# Patient Record
Sex: Female | Born: 1975 | Race: White | Hispanic: No | Smoking: Current every day smoker
Health system: Southern US, Community
[De-identification: ages and names within clinical notes are randomized; demographics above are authoritative.]

## PROBLEM LIST (undated history)

## (undated) DIAGNOSIS — I10 Essential (primary) hypertension: Secondary | ICD-10-CM

## (undated) HISTORY — PX: ABDOMINAL HYSTERECTOMY: SHX81

## (undated) HISTORY — PX: BRAIN SURGERY: SHX531

## (undated) HISTORY — PX: ANKLE SURGERY: SHX546

---

## 2000-01-21 ENCOUNTER — Other Ambulatory Visit: Admission: RE | Admit: 2000-01-21 | Discharge: 2000-01-21 | Payer: Self-pay | Admitting: Obstetrics & Gynecology

## 2000-11-09 ENCOUNTER — Inpatient Hospital Stay (HOSPITAL_COMMUNITY): Admission: AD | Admit: 2000-11-09 | Discharge: 2000-11-12 | Payer: Self-pay | Admitting: Obstetrics & Gynecology

## 2000-12-27 ENCOUNTER — Emergency Department (HOSPITAL_COMMUNITY): Admission: EM | Admit: 2000-12-27 | Discharge: 2000-12-27 | Payer: Self-pay | Admitting: Emergency Medicine

## 2001-01-04 ENCOUNTER — Ambulatory Visit (HOSPITAL_COMMUNITY): Admission: RE | Admit: 2001-01-04 | Discharge: 2001-01-04 | Payer: Self-pay | Admitting: Orthopaedic Surgery

## 2001-01-04 ENCOUNTER — Encounter: Payer: Self-pay | Admitting: Orthopaedic Surgery

## 2001-02-09 ENCOUNTER — Other Ambulatory Visit: Admission: RE | Admit: 2001-02-09 | Discharge: 2001-02-09 | Payer: Self-pay | Admitting: Obstetrics and Gynecology

## 2006-01-27 ENCOUNTER — Observation Stay (HOSPITAL_COMMUNITY): Admission: AD | Admit: 2006-01-27 | Discharge: 2006-01-28 | Payer: Self-pay | Admitting: Obstetrics and Gynecology

## 2006-02-19 ENCOUNTER — Encounter (INDEPENDENT_AMBULATORY_CARE_PROVIDER_SITE_OTHER): Payer: Self-pay | Admitting: Specialist

## 2006-02-19 ENCOUNTER — Inpatient Hospital Stay (HOSPITAL_COMMUNITY): Admission: RE | Admit: 2006-02-19 | Discharge: 2006-02-21 | Payer: Self-pay | Admitting: Obstetrics and Gynecology

## 2006-07-03 ENCOUNTER — Ambulatory Visit (HOSPITAL_COMMUNITY): Admission: RE | Admit: 2006-07-03 | Discharge: 2006-07-03 | Payer: Self-pay | Admitting: Internal Medicine

## 2006-07-10 ENCOUNTER — Ambulatory Visit (HOSPITAL_COMMUNITY): Admission: RE | Admit: 2006-07-10 | Discharge: 2006-07-10 | Payer: Self-pay | Admitting: Internal Medicine

## 2006-12-10 ENCOUNTER — Ambulatory Visit: Payer: Self-pay | Admitting: Pain Medicine

## 2007-01-05 ENCOUNTER — Ambulatory Visit (HOSPITAL_COMMUNITY): Admission: RE | Admit: 2007-01-05 | Discharge: 2007-01-05 | Payer: Self-pay | Admitting: Internal Medicine

## 2007-09-23 IMAGING — US US TRANSVAGINAL NON-OB
1 series · 14 of 25 positions shown · non-contrast
Comparison: none

CLINICAL DATA: Anemia, please evaluate.
 TRANSABDOMINAL AND TRANSVAGINAL PELVIC ULTRASOUND:
TECHNIQUE: Both transabdominal and transvaginal ultrasound examinations of the pelvis were performed including evaluation of the uterus, ovaries, adnexal regions, and pelvic cul-de-sac.

[Series 1: unknown · 0.33mm/px · 14 of 87 slices shown]
[im 1/87]
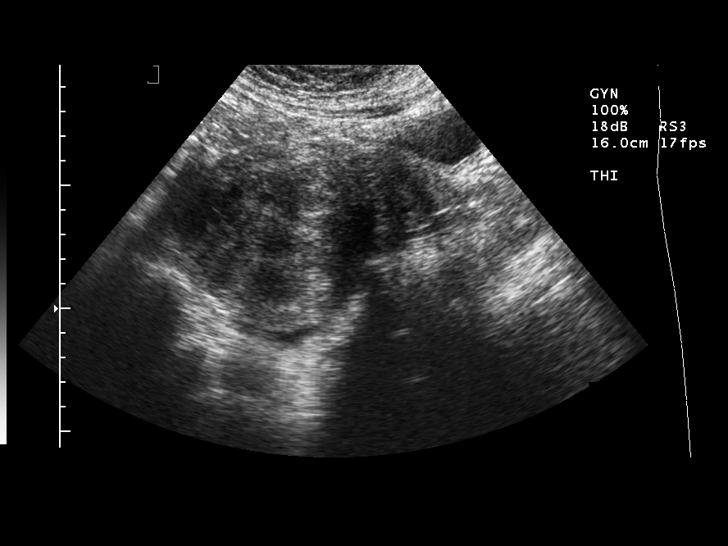
[im 8/87]
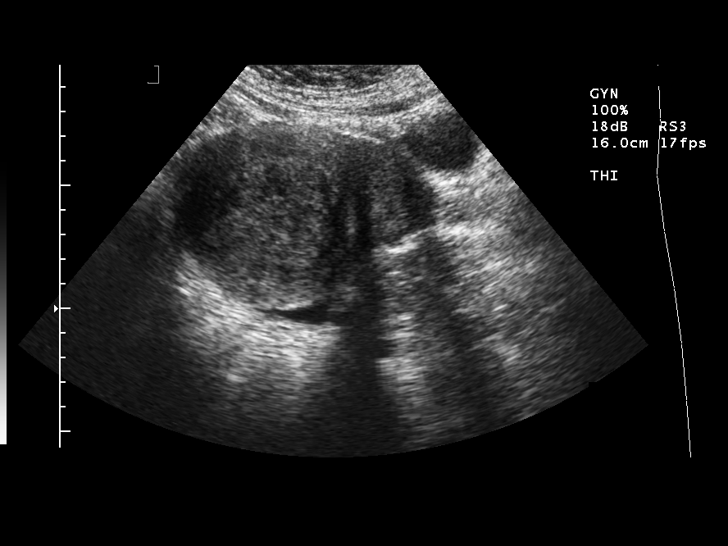
[im 15/87]
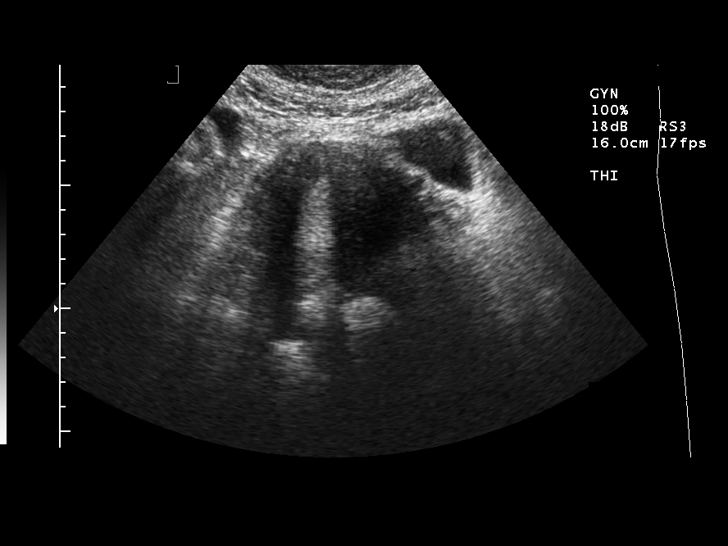
[im 22/87]
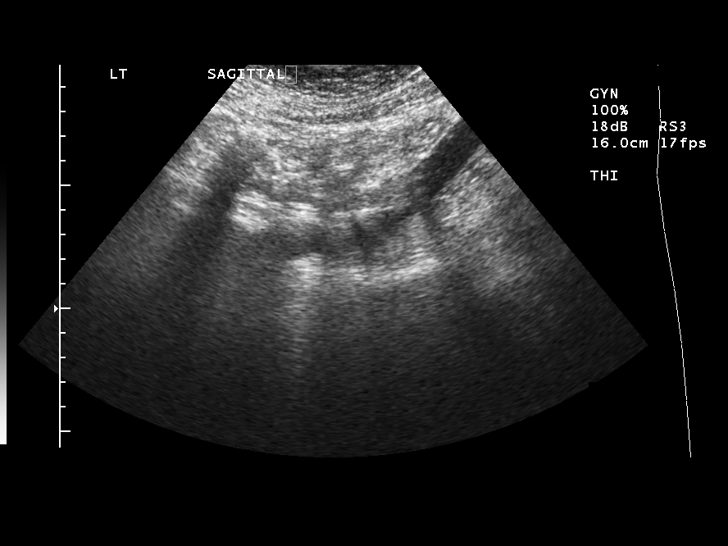
[im 29/87]
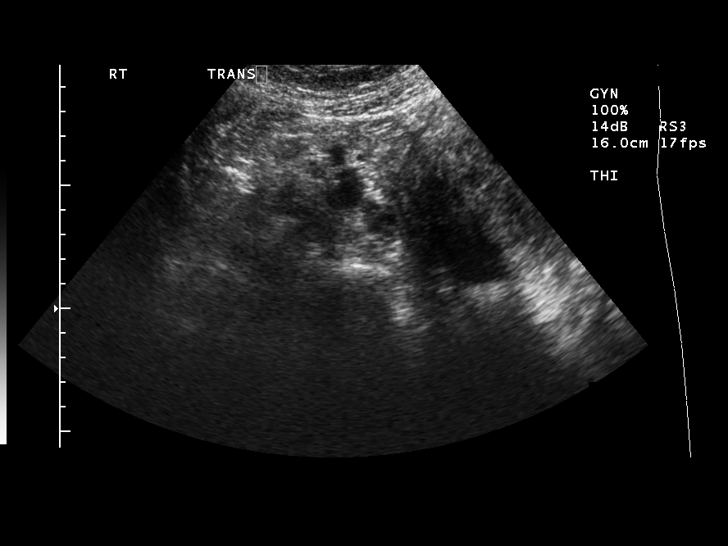
[im 33/87]
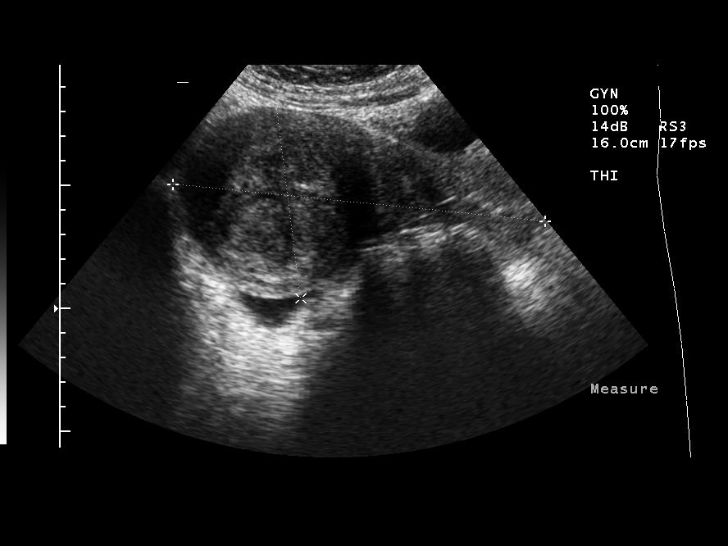
[im 40/87]
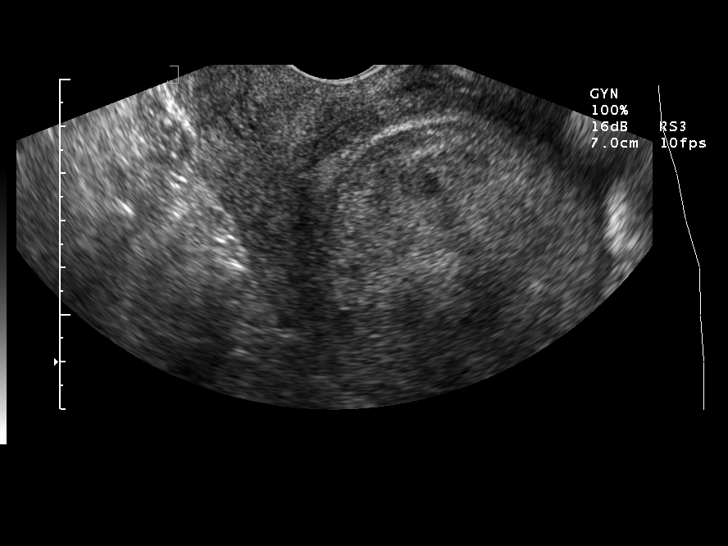
[im 47/87]
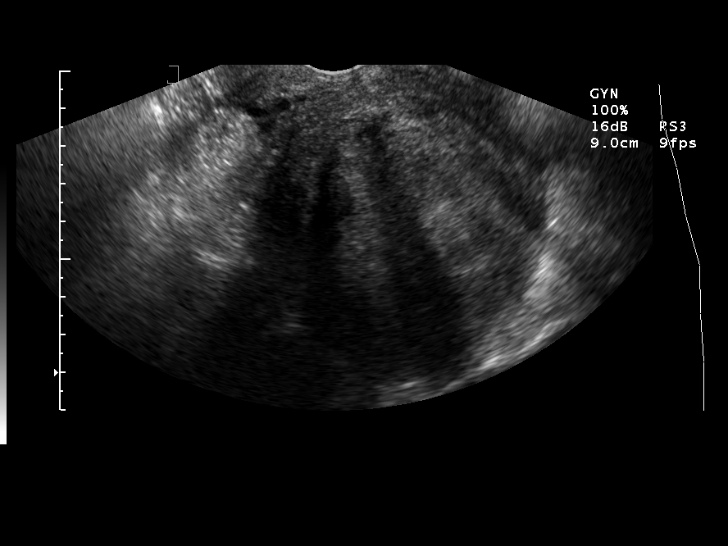
[im 54/87]
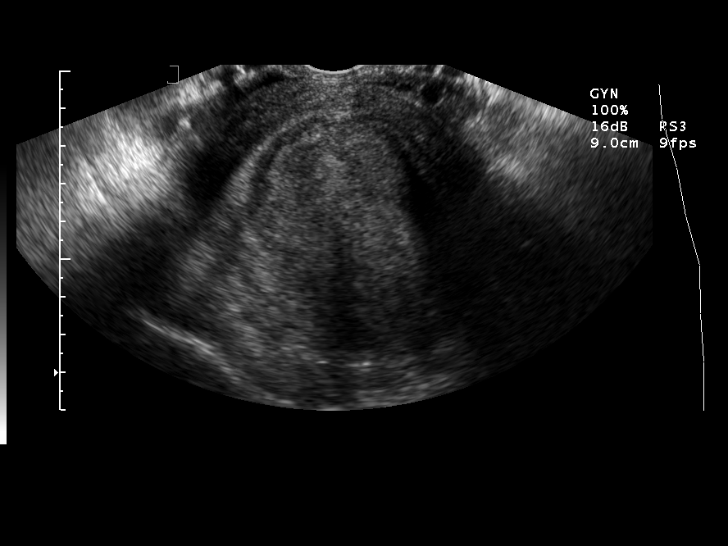
[im 58/87]
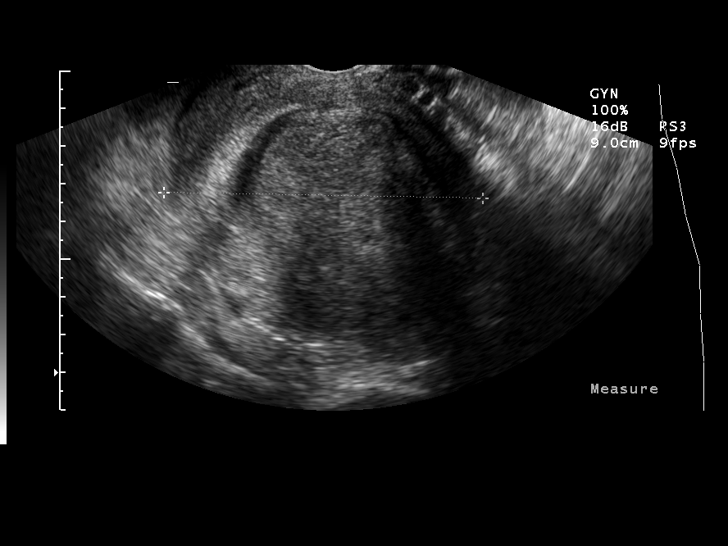
[im 65/87]
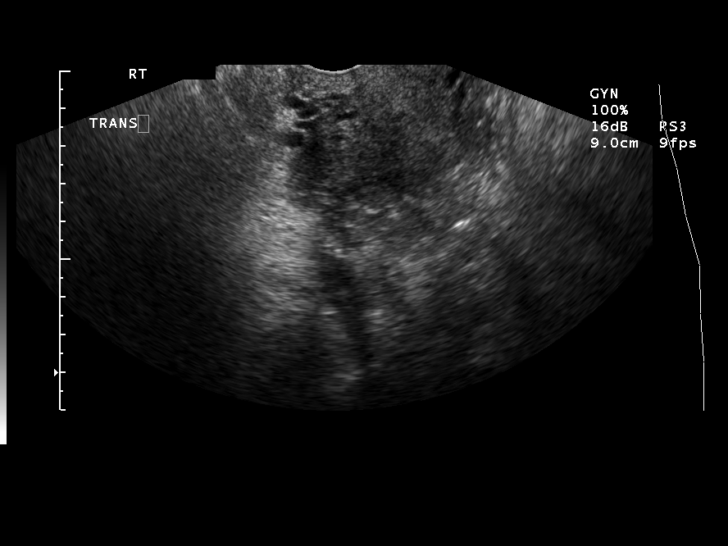
[im 72/87]
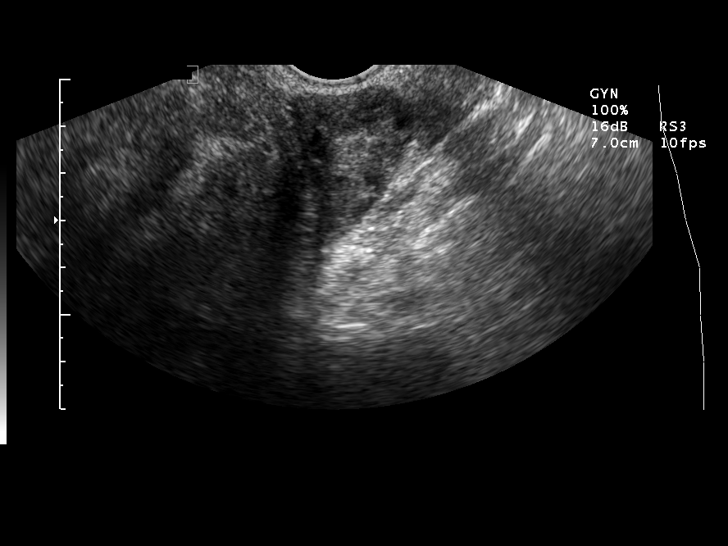
[im 79/87]
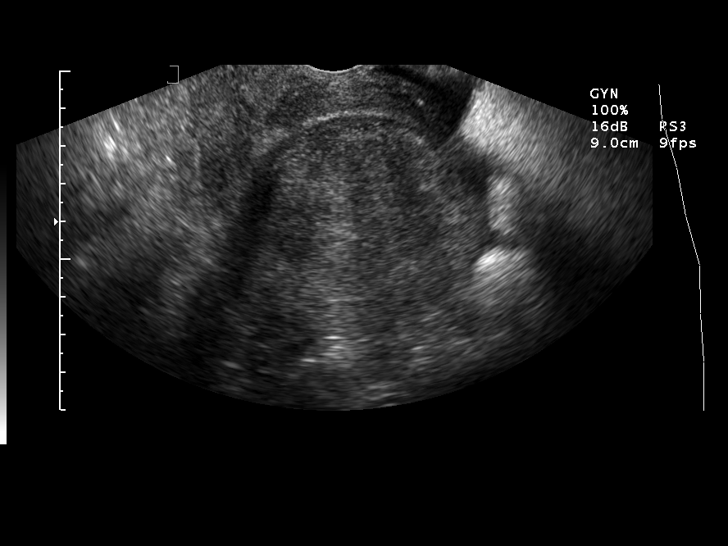
[im 87/87]
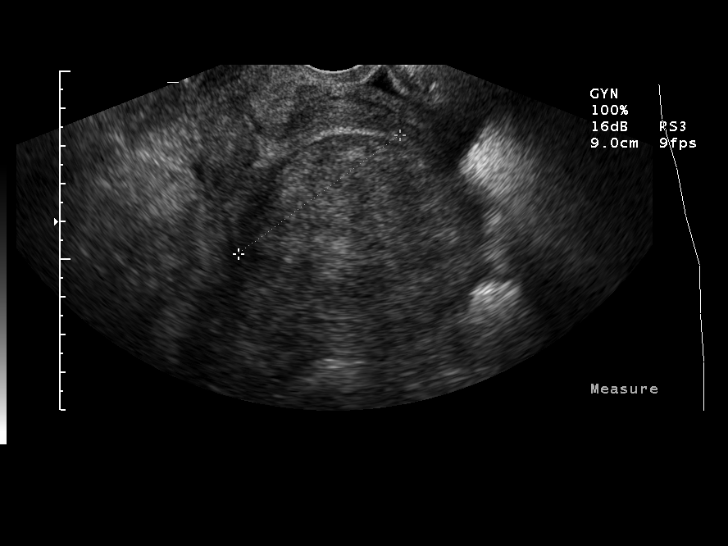

[14 of 25 positions shown; findings below may reference images not displayed]

FINDINGS: The uterus is enlarged and contains multiple fibroids.  The uterus measures 15.2 x 7.9 x 9.2 in size.  The largest fibroid is located within the body/fundus of the uterus and measures 6.5 x 6.4 x 6.7 cm in size.  This does extend into a submucosal location.  The right ovary is not visualized.  The left ovary has a normal appearance with multiple small follicles.  The left ovary measures 3.7 x 1.8 x 2.6 cm in size.  There is a small amount of free pelvic fluid present.  The endometrial stripe is distorted and not well measured due to the large uterine fibroid.
IMPRESSION: Enlarged, likely fibroid uterus with a large fibroid measuring 6.7 cm in size within the fundal portion of the uterus extending into the submucosal location.  Nonvisualization of the right ovary.  Normal appearing left ovary.

## 2008-08-30 IMAGING — CR DG FOOT COMPLETE 3+V*R*
3 series · 3 of 3 positions shown · non-contrast
Comparison: none

HISTORY: Right fifth toe pain and swelling, a recent injury

RIGHT FOOT 3 VIEWS:
Oblique fracture distal diaphysis of proximal phalanx of right fifth toe.
Mild overriding and lateral displacement.
Mineralization otherwise normal.
Joint spaces preserved.
No additional fracture or dislocation.

[view not recorded (1 of 3)]
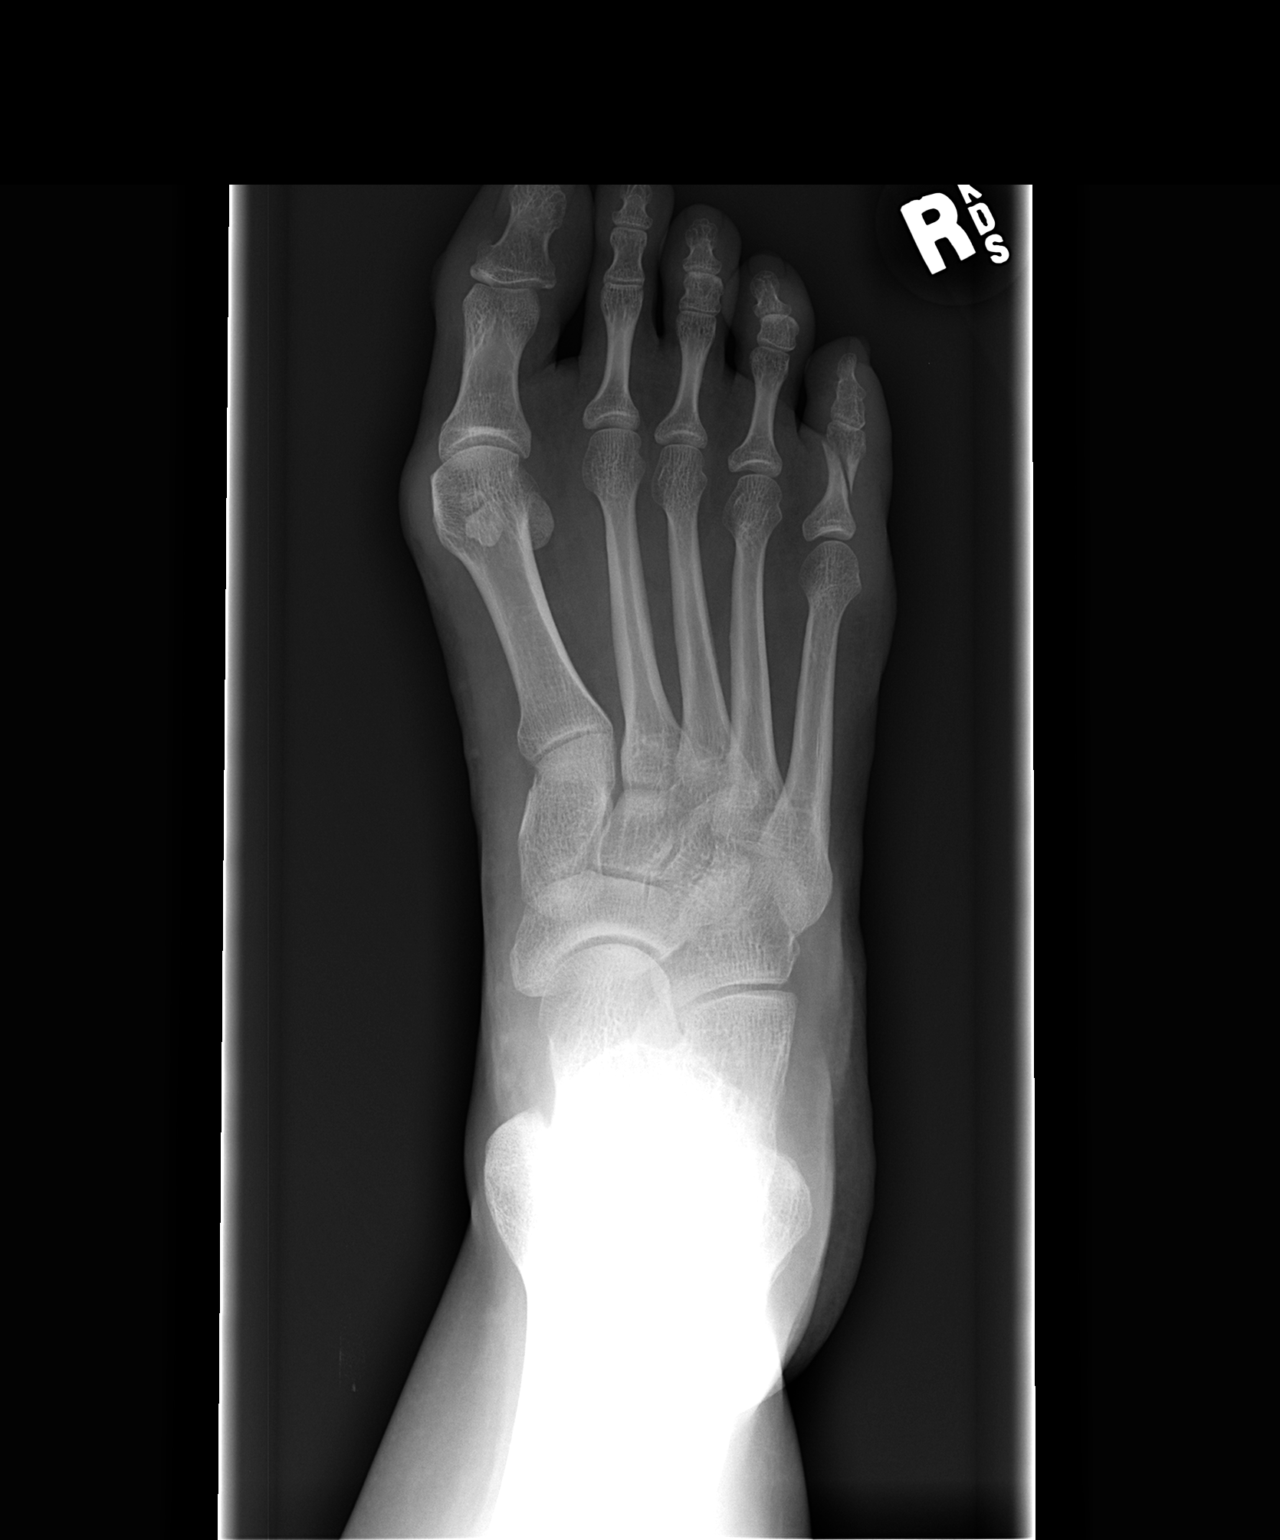

[view not recorded (2 of 3)]
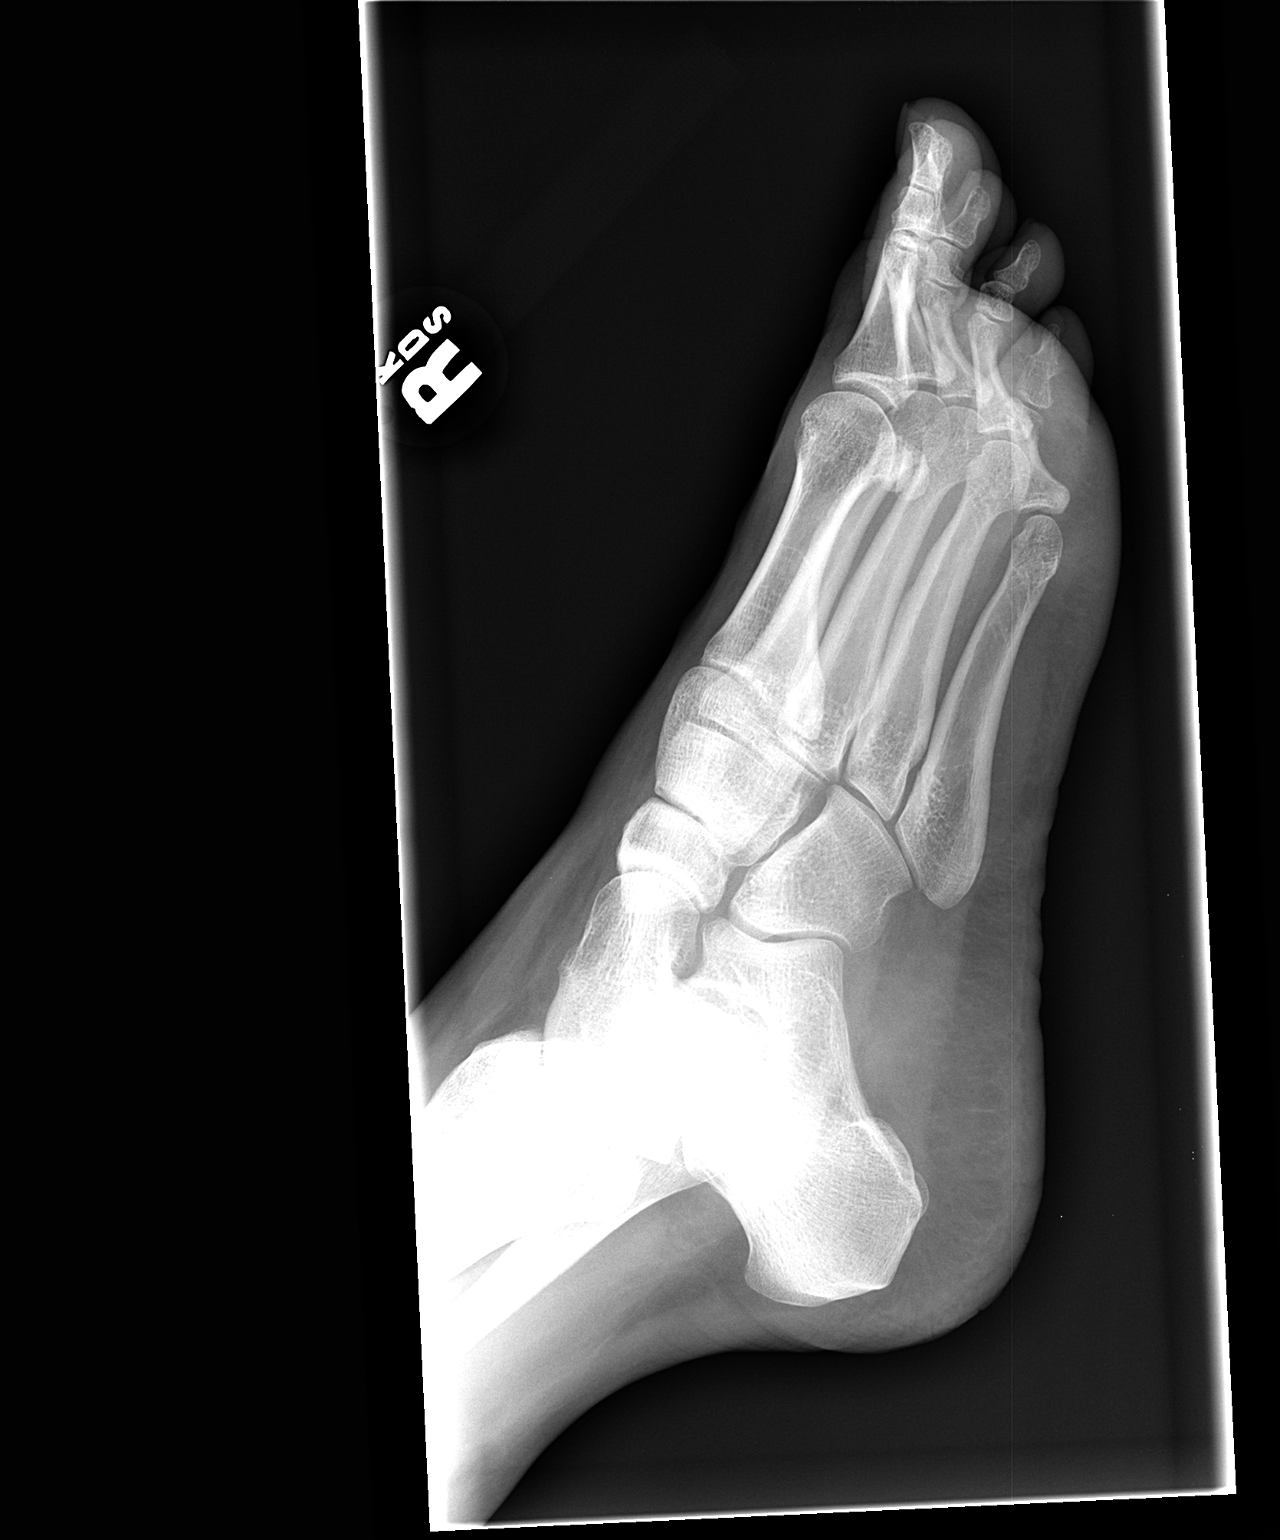

[view not recorded (3 of 3)]
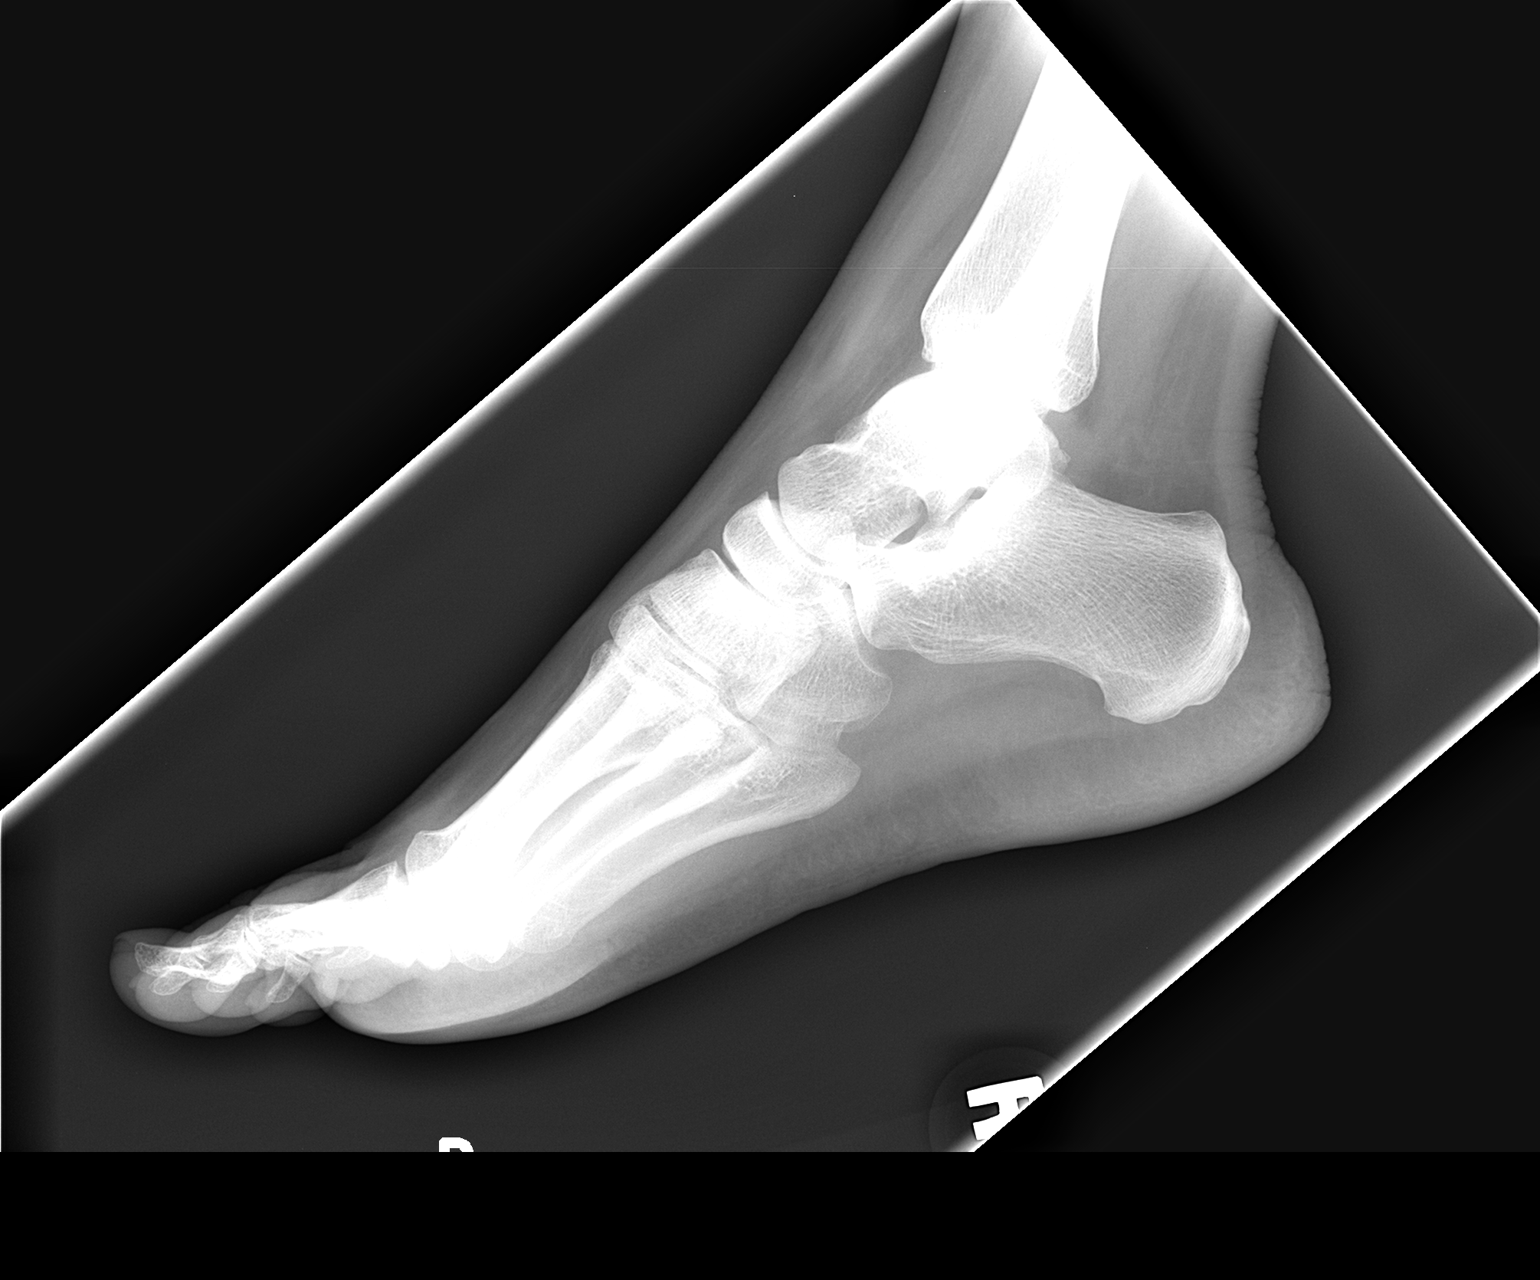

[3 of 3 positions shown; findings below may reference images not displayed]

IMPRESSION: Minimally displaced fracture, proximal phalanx right fifth toe.

## 2008-12-06 ENCOUNTER — Ambulatory Visit (HOSPITAL_COMMUNITY): Admission: RE | Admit: 2008-12-06 | Discharge: 2008-12-06 | Payer: Self-pay | Admitting: Internal Medicine

## 2009-01-09 ENCOUNTER — Encounter (HOSPITAL_COMMUNITY): Admission: RE | Admit: 2009-01-09 | Discharge: 2009-01-25 | Payer: Self-pay | Admitting: Internal Medicine

## 2009-07-14 ENCOUNTER — Emergency Department (HOSPITAL_COMMUNITY): Admission: EM | Admit: 2009-07-14 | Discharge: 2009-07-14 | Payer: Self-pay | Admitting: Emergency Medicine

## 2009-07-21 ENCOUNTER — Emergency Department (HOSPITAL_COMMUNITY): Admission: EM | Admit: 2009-07-21 | Discharge: 2009-07-21 | Payer: Self-pay | Admitting: Emergency Medicine

## 2009-07-24 ENCOUNTER — Ambulatory Visit: Payer: Self-pay | Admitting: Family Medicine

## 2009-07-24 DIAGNOSIS — J019 Acute sinusitis, unspecified: Secondary | ICD-10-CM

## 2009-07-24 DIAGNOSIS — F17200 Nicotine dependence, unspecified, uncomplicated: Secondary | ICD-10-CM | POA: Insufficient documentation

## 2009-07-24 DIAGNOSIS — H6690 Otitis media, unspecified, unspecified ear: Secondary | ICD-10-CM | POA: Insufficient documentation

## 2009-07-24 DIAGNOSIS — F329 Major depressive disorder, single episode, unspecified: Secondary | ICD-10-CM

## 2009-08-01 ENCOUNTER — Ambulatory Visit: Payer: Self-pay | Admitting: Family Medicine

## 2009-08-01 DIAGNOSIS — N390 Urinary tract infection, site not specified: Secondary | ICD-10-CM

## 2009-08-01 LAB — CONVERTED CEMR LAB
Glucose, Urine, Semiquant: NEGATIVE
Specific Gravity, Urine: >=1.03
pH: 5

## 2009-08-02 ENCOUNTER — Encounter: Payer: Self-pay | Admitting: Physician Assistant

## 2009-08-29 ENCOUNTER — Encounter: Payer: Self-pay | Admitting: Physician Assistant

## 2009-10-10 ENCOUNTER — Emergency Department (HOSPITAL_COMMUNITY): Admission: EM | Admit: 2009-10-10 | Discharge: 2009-10-10 | Payer: Self-pay | Admitting: Emergency Medicine

## 2009-10-24 ENCOUNTER — Ambulatory Visit: Payer: Self-pay | Admitting: Family Medicine

## 2009-10-24 DIAGNOSIS — F411 Generalized anxiety disorder: Secondary | ICD-10-CM | POA: Insufficient documentation

## 2009-11-23 ENCOUNTER — Ambulatory Visit: Payer: Self-pay | Admitting: Physician Assistant

## 2009-12-10 ENCOUNTER — Ambulatory Visit: Payer: Self-pay | Admitting: Family Medicine

## 2009-12-10 DIAGNOSIS — J039 Acute tonsillitis, unspecified: Secondary | ICD-10-CM

## 2009-12-10 LAB — CONVERTED CEMR LAB: Rapid Strep: NEGATIVE

## 2010-01-31 ENCOUNTER — Telehealth: Payer: Self-pay | Admitting: Family Medicine

## 2010-05-28 NOTE — Assessment & Plan Note (Signed)
Summary: anxiety- room 1   Vital Signs:  Patient profile:   35 year old female Height:      69 inches Weight:      173.75 pounds BMI:     25.75 O2 Sat:      98 % on Room air Pulse rate:   120 / minute Resp:     16 per minute BP sitting:   122 / 90  (left arm)  Vitals Entered By: Adella Hare LPN (October 24, 2009 2:09 PM) CC: anxiety Is Patient Diabetic? No Pain Assessment Patient in pain? no      Comments did not bring meds to office but states is only taking lamictal   CC:  anxiety.  History of Present Illness: Pt is here today due to anxiety attacks.  She is going thru a divorce and is living in the same house as her husband.  She is having anxiety attacks a couple times a week and not sleeping well due to the stress.  She feels that her family is not supportive of her decision. Her 28 yo son is having difficulty coping with the divorce.  She felt like she needed to change churches but really misses her old church. She spoke to her psychiatrist and is going to start counseling next week with a therapist.  She was on Clonazepam in the past but has been out of this for a couple of mos.  Was prescribed three times a day per pt but she took it at [facility] to help her sleep.    Allergies (verified): No Known Drug Allergies  Past History:  Past medical history reviewed for relevance to current acute and chronic problems.  Past Medical History: Reviewed history from 07/24/2009 and no changes required. Depression Cervical herniated discs - Dr Ethelene Hal at [facility]  Review of Systems Psych:  Complains of anxiety, depression, easily tearful, and panic attacks; denies suicidal thoughts/plans, thoughts of violence, and thoughts /plans of harming others.  Physical Exam  General:  alert, well-developed, well-nourished, and well-hydrated.   Head:  Normocephalic and atraumatic without obvious abnormalities. No apparent alopecia or balding. Ears:  External ear exam shows no  significant lesions or deformities.  Otoscopic examination reveals clear canals, tympanic membranes are intact bilaterally without bulging, retraction, inflammation or discharge. Hearing is grossly normal bilaterally. Nose:  External nasal examination shows no deformity or inflammation. Nasal mucosa are pink and moist without lesions or exudates. Mouth:  Oral mucosa and oropharynx without lesions or exudates.  Teeth in good repair. Neck:  No deformities, masses, or tenderness noted. Lungs:  Normal respiratory effort, chest expands symmetrically. Lungs are clear to auscultation, no crackles or wheezes. Heart:  Normal rate and regular rhythm. S1 and S2 normal without gallop, murmur, click, rub or other extra sounds. Cervical Nodes:  No lymphadenopathy noted Psych:  Oriented X3, good eye contact, and tearful and crying throughout most of the visit.   Impression & Recommendations:  Problem # 1:  DEPRESSION (ICD-311) Assessment Deteriorated Pt to continue f/u with her psychiatrist ,and begin seeing therapist as discussed. Her updated medication list for this problem includes:    Clonazepam 1 Mg Tabs (Clonazepam) ..... One tab by mouth two times a day  Problem # 2:  ANXIETY DISORDER (ICD-300.00) Assessment: Deteriorated  Her updated medication list for this problem includes:    Clonazepam 1 Mg Tabs (Clonazepam) ..... One tab by mouth two times a day  Complete Medication List: 1)  Lamictal 100 Mg Tabs (Lamotrigine) .Marland KitchenMarland KitchenMarland Kitchen  One tab by mouth once daily 2)  Clonazepam 1 Mg Tabs (Clonazepam) .... One tab by mouth two times a day  Patient Instructions: 1)  Please schedule a follow-up appointment in 1 month. 2)  Tobacco is very bad for your health and your loved ones! You Should stop smoking!. 3)  Stop Smoking Tips: Choose a Quit date. Cut down before the Quit date. decide what you will do as a substitute when you feel the urge to smoke(gum,toothpick,exercise). 4)  I have refilled Clonazepam for you.   Take one at bedtime to help you sleep.  You may 1/2 to 1 tablet during the day if needed for anxiety attacks. Prescriptions: CLONAZEPAM 1 MG TABS (CLONAZEPAM) one tab by mouth two times a day  #60 x 1   Entered and Authorized by:   Esperanza Sheets PA   Signed by:   Esperanza Sheets PA on 10/24/2009   Method used:   Printed then faxed to ...       [facility] [address] [phone]* (retail)       [address] [place]  [address]       Ph: [phone]       Fax: [phone]   RxID:   [id number]

## 2010-05-28 NOTE — Progress Notes (Signed)
Summary: rx  Phone Note Call from Patient   Summary of Call: pt needs to get a refill on klonopin 1mg . [facility] [phone] Initial call taken by: Rudene Anda,  January 31, 2010 9:25 AM    Prescriptions: CLONAZEPAM 1 MG TABS (CLONAZEPAM) one tab by mouth two times a day  #60 x 1   Entered by:   Everitt Amber LPN   Authorized by:   Syliva Overman MD   Signed by:   Everitt Amber LPN on 09/81/1914   Method used:   Printed then faxed to ...       [facility] [address] [phone]* (retail)       [address], [place]  [address]       Ph: [phone]       Fax: [phone]   RxID:   [id number]

## 2010-05-28 NOTE — Assessment & Plan Note (Signed)
Summary: new patient- room 3   Vital Signs:  Patient profile:   35 year old female Height:      69 inches Weight:      202 pounds BMI:     29.94 O2 Sat:      98 % on Room air Pulse rate:   98 / minute Resp:     16 per minute BP sitting:   140 / 90  (left arm)  Vitals Entered By: Adella Hare LPN (July 24, 2009 8:46 AM) CC: new patient / head congestion Is Patient Diabetic? No Pain Assessment Patient in pain? no        CC:  new patient / head congestion.  History of Present Illness: New pt here to establish care with new PCP.  Pt c/o cold syptoms x 5 days.  Started with Lt ear pain last Fri - worsening. Feels like fluid in her ear.  No drainage.  Went to ER last Fri. Was given ear gtts (Benzocaine). Now c/o sinus congestion, discolored drainage, and cough. No sore thrt.  Cough is sometimes prod with yellow phlegm.  Felt feverish yesterday. +smoker.  Has tried otc cold meds without much relief.  Pt states she is starting a program thru her ins to help her quit smoking. They will provide her with patches.  Current Medications (verified): 1)  Lamictal 100 Mg Tabs (Lamotrigine) .... One Tab By Mouth Once Daily 2)  Clonazepam 1 Mg Tabs (Clonazepam) .... One Tab By Mouth Once Daily 3)  Ambien Cr 12.5 Mg Cr-Tabs (Zolpidem Tartrate) .... One Tab By Mouth At Bedtime 4)  Hydrocodone-Acetaminophen 5-325 Mg Tabs (Hydrocodone-Acetaminophen) .... One Tab By Mouth Three Times A Day  Allergies (verified): No Known Drug Allergies  Past History:  Past medical, surgical, family and social histories (including risk factors) reviewed, and no changes noted (except as noted below).  Past Medical History: Depression Cervical herniated discs - Dr E[facility]lene Hal at [facility]  Past Surgical History: Hysterectomy  Family History: Reviewed history and no changes required. Mother living- Hyperlipidemia, DM, Depression, Thyroid dz Father deceased- health status unknown One brother  living- bladder cancer, Hyperlipidemia, pre DM  Social History: Reviewed history and no changes required. Unemployed Married 13 years One child Current Smoker less than a pack a day Alcohol use-no Drug use-no Regular exercise-yes Smoking Status:  current Drug Use:  no Does Patient Exercise:  yes  Review of Systems General:  Complains of fever; denies chills. ENT:  Complains of earache, hoarseness, nasal congestion, postnasal drainage, and sinus pressure; denies sore throat. CV:  Denies chest pain or discomfort. Resp:  Complains of cough and sputum productive; denies shortness of breath and wheezing. Heme:  Denies enlarge lymph nodes.  Physical Exam  General:  Well-developed,well-nourished,in no acute distress; alert,appropriate and cooperative throughout examination Head:  Normocephalic and atraumatic without obvious abnormalities. No apparent alopecia or balding. Ears:  no external deformities.  Rt TM & EAC nl.  Lt TM red & dull.  Lt EAC neg Nose:  Yellow mucus noted.no external deformity, no mucosal edema, and no sinus percussion tenderness.   Mouth:  good dentition and no posterior lymphoid hypertrophy.  Tonsils 1 + erythematous, with White pustules on Rt. Neck:  supple and no masses.   Lungs:  Normal respiratory effort, chest expands symmetrically. Lungs are clear to auscultation, no crackles or wheezes. Heart:  Normal rate and regular rhythm. S1 and S2 normal without gallop, murmur, click, rub or other extra sounds. Cervical Nodes:  1+ tonsilar  nodes bilat Psych:  Cognition and judgment appear intact. Alert and cooperative with normal attention span and concentration. No apparent delusions, illusions, hallucinations   Impression & Recommendations:  Problem # 1:  SINUSITIS, ACUTE (ICD-461.9) Assessment New  Her updated medication list for this problem includes:    Zithromax Z-pak 250 Mg Tabs (Azithromycin) .Marland Kitchen... As directed  Problem # 2:  OTITIS MEDIA, LEFT  (ICD-382.9) Assessment: New  Her updated medication list for this problem includes:    Zithromax Z-pak 250 Mg Tabs (Azithromycin) .Marland Kitchen... As directed  Problem # 3:  NICOTINE ADDICTION (ICD-305.1) Assessment: Comment Only  Encouraged smoking cessation and discussed different methods for smoking cessation.   Complete Medication List: 1)  Lamictal 100 Mg Tabs (Lamotrigine) .... One tab by mouth once daily 2)  Clonazepam 1 Mg Tabs (Clonazepam) .... One tab by mouth once daily 3)  Ambien Cr 12.5 Mg Cr-tabs (Zolpidem tartrate) .... One tab by mouth at bedtime 4)  Hydrocodone-acetaminophen 5-325 Mg Tabs (Hydrocodone-acetaminophen) .... One tab by mouth three times a day 5)  Zithromax Z-pak 250 Mg Tabs (Azithromycin) .... As directed  Patient Instructions: 1)  Please schedule a follow-up appointment in 2 months for physical/pap. 2)  Tobacco is very bad for your health and your loved ones! You Should stop smoking!. 3)  Stop Smoking Tips: Choose a Quit date. Cut down before the Quit date. decide what you will do as a substitute when you feel the urge to smoke(gum,toothpick,exercise). 4)  Get plenty of rest, drink lots of clear liquids, and use Tylenol or Ibuprofen for fever and comfort. Return in 7-10 days if you're not better:sooner if you're feeling worse. Prescriptions: ZITHROMAX Z-PAK 250 MG TABS (AZITHROMYCIN) as directed  #1 pack x 0   Entered and Authorized by:   Esperanza Sheets PA   Signed by:   Esperanza Sheets PA on 07/24/2009   Method used:   Electronically to        [facility] [phone]* (retail)       [address], [place]  [address]       Ph: [phone]       Fax: [phone]   RxID:   [id number]

## 2010-05-28 NOTE — Assessment & Plan Note (Signed)
Summary: follow up- room 3   Vital Signs:  Patient profile:   35 year old female Height:      69 inches Weight:      172 pounds BMI:     25.49 O2 Sat:      98 % on Room air Pulse rate:   84 / minute Resp:     16 per minute BP sitting:   122 / 80  (left arm)  Vitals Entered By: Adella Hare LPN (November 23, 2009 10:47 AM) CC: follow-up visit Is Patient Diabetic? No Pain Assessment Patient in pain? no        CC:  follow-up visit.  History of Present Illness: Pt presents today for f/u on anxiety.  She continues to have a stressful home life and feels astranged from her family.  Though she states she is doing better than she was at her last OV in June.  She is using the Clonazepam as needed, and doesnt need it everyday.  She is seeing a therapist and also feels that this has helped.  Still is not sleeping well, but Clonazepam helps when she takes it. Pt has retained an atty to help her with her divorce, etc.    Allergies (verified): No Known Drug Allergies  Past History:  Past medical history reviewed for relevance to current acute and chronic problems.  Past Medical History: Reviewed history from 07/24/2009 and no changes required. Depression Cervical herniated discs - Dr Ethelene Hal at [facility]  Review of Systems Psych:  Complains of anxiety and panic attacks; denies depression, sense of great danger, suicidal thoughts/plans, thoughts of violence, and thoughts /plans of harming others.  Physical Exam  General:  Well-developed,well-nourished,in no acute distress; alert,appropriate and cooperative throughout examination Head:  Normocephalic and atraumatic without obvious abnormalities. No apparent alopecia or balding. Ears:  External ear exam shows no significant lesions or deformities.  Otoscopic examination reveals clear canals, tympanic membranes are intact bilaterally without bulging, retraction, inflammation or discharge. Hearing is grossly normal  bilaterally. Nose:  External nasal examination shows no deformity or inflammation. Nasal mucosa are pink and moist without lesions or exudates. Mouth:  Oral mucosa and oropharynx without lesions or exudates.  Teeth in good repair. Neck:  No deformities, masses, or tenderness noted. Lungs:  Normal respiratory effort, chest expands symmetrically. Lungs are clear to auscultation, no crackles or wheezes. Heart:  Normal rate and regular rhythm. S1 and S2 normal without gallop, murmur, click, rub or other extra sounds. Cervical Nodes:  No lymphadenopathy noted Psych:  normally interactive, good eye contact, not anxious appearing, and not depressed appearing.  Pt is not tearful today.   Impression & Recommendations:  Problem # 1:  ANXIETY DISORDER (ICD-300.00) Assessment Improved Pt still has Clonazepam from original prescription and 1 refill available.  Her updated medication list for this problem includes:    Clonazepam 1 Mg Tabs (Clonazepam) ..... One tab by mouth two times a day  Complete Medication List: 1)  Lamictal 100 Mg Tabs (Lamotrigine) .... One tab by mouth once daily 2)  Clonazepam 1 Mg Tabs (Clonazepam) .... One tab by mouth two times a day  Patient Instructions: 1)  Follow up appt in 2-3 mos. 2)  You may return sooner if needed. 3)  Continue Clonazepam as needed 4)  Continue with counseling.

## 2010-05-28 NOTE — Assessment & Plan Note (Signed)
Summary: ? uti- room 2   Vital Signs:  Patient profile:   35 year old female Height:      69 inches Weight:      201 pounds BMI:     29.79 O2 Sat:      98 % on Room air Pulse rate:   90 / minute Resp:     16 per minute BP sitting:   122 / 90  (left arm)  Vitals Entered By: Adella Hare LPN (August 02, 6043 9:55 AM) CC: increased urinary frequency, buring with urination Is Patient Diabetic? No Pain Assessment Patient in pain? no        CC:  increased urinary frequency and buring with urination.  History of Present Illness: Pt presents today with 5 days of UTI syptoms. She c/o dysuria, freq, urgency & hesitancy. No fever or chills. No LBP.  The sinus infection she was seen for previously has improved.  Current Medications (verified): 1)  Lamictal 100 Mg Tabs (Lamotrigine) .... One Tab By Mouth Once Daily 2)  Clonazepam 1 Mg Tabs (Clonazepam) .... One Tab By Mouth Once Daily 3)  Ambien Cr 12.5 Mg Cr-Tabs (Zolpidem Tartrate) .... One Tab By Mouth At Bedtime 4)  Hydrocodone-Acetaminophen 5-325 Mg Tabs (Hydrocodone-Acetaminophen) .... One Tab By Mouth Three Times A Day 5)  Zithromax Z-Pak 250 Mg Tabs (Azithromycin) .... As Directed  Allergies (verified): No Known Drug Allergies  Review of Systems General:  Denies chills and fever. GI:  Denies abdominal pain, nausea, and vomiting. GU:  Complains of dysuria, nocturia, urinary frequency, and urinary hesitancy; denies discharge.  Physical Exam  General:  Well-developed,well-nourished,in no acute distress; alert,appropriate and cooperative throughout examination Head:  Normocephalic and atraumatic without obvious abnormalities. No apparent alopecia or balding. Neck:  No deformities, masses, or tenderness noted. Lungs:  Normal respiratory effort, chest expands symmetrically. Lungs are clear to auscultation, no crackles or wheezes. Heart:  Normal rate and regular rhythm. S1 and S2 normal without gallop, murmur, click, rub or  other extra sounds. Abdomen:  soft, no distention, no masses, no hepatomegaly, and no splenomegaly.  + suprapubic TTP Neg [employer] Cervical Nodes:  No lymphadenopathy noted Psych:  Cognition and judgment appear intact. Alert and cooperative with normal attention span and concentration. No apparent delusions, illusions, hallucinations   Impression & Recommendations:  Problem # 1:  UTI (ICD-599.0) Assessment New  The following medications were removed from the medication list:    Zithromax Z-pak 250 Mg Tabs (Azithromycin) .Marland Kitchen... As directed Her updated medication list for this problem includes:    Ciprofloxacin Hcl 250 Mg Tabs (Ciprofloxacin hcl) .Marland Kitchen... Take 1 two times a day x 3 days    Pyridium 100 Mg Tabs (Phenazopyridine hcl) .Marland Kitchen... Take 1 three times a day as needed for discomfort with urination.  Complete Medication List: 1)  Lamictal 100 Mg Tabs (Lamotrigine) .... One tab by mouth once daily 2)  Clonazepam 1 Mg Tabs (Clonazepam) .... One tab by mouth once daily 3)  Ambien Cr 12.5 Mg Cr-tabs (Zolpidem tartrate) .... One tab by mouth at bedtime 4)  Hydrocodone-acetaminophen 5-325 Mg Tabs (Hydrocodone-acetaminophen) .... One tab by mouth three times a day 5)  Ciprofloxacin Hcl 250 Mg Tabs (Ciprofloxacin hcl) .... Take 1 two times a day x 3 days 6)  Pyridium 100 Mg Tabs (Phenazopyridine hcl) .... Take 1 three times a day as needed for discomfort with urination.  Other Orders: UA Dipstick W/ Micro (manual) (40981) T-Culture, Urine ([id number])  Patient Instructions: 1)  Please schedule a follow-up appointment as needed. 2)  Increase fluids. 3)  Watch for fever, or worsening of syptoms. 4)  I have prescribed an antibiotic and a medication to help with the discomfort with urination. Prescriptions: PYRIDIUM 100 MG TABS (PHENAZOPYRIDINE HCL) take 1 three times a day as needed for discomfort with urination.  #6 x 0   Entered and Authorized by:   Esperanza Sheets PA   Signed by:   Esperanza Sheets PA on 08/01/2009   Method used:   Electronically to        [facility]. [address] [phone]* (retail)       [address] [place], [place]  [address]       Ph: [phone]       Fax: [phone]   RxID:   [id number] CIPROFLOXACIN HCL 250 MG TABS (CIPROFLOXACIN HCL) take 1 two times a day x 3 days  #6 x 0   Entered and Authorized by:   Esperanza Sheets PA   Signed by:   Esperanza Sheets PA on 08/01/2009   Method used:   Electronically to        [facility] [phone]* (retail)       [address] [place], [place]  [address]       Ph: [phone]       Fax: [phone]   RxID:   [id number]   Laboratory Results   Urine Tests  Date/Time Received: August 01, 2009  Date/Time Reported: August 01, 2009   Routine Urinalysis   Color: yellow Appearance: Clear Glucose: negative   (Normal Range: Negative) Bilirubin: small   (Normal Range: Negative) Ketone: negative   (Normal Range: Negative) Spec. Gravity: >=1.030   (Normal Range: 1.003-1.035) Blood: trace-lysed   (Normal Range: Negative) pH: 5.0   (Normal Range: 5.0-8.0) Protein: 30   (Normal Range: Negative) Urobilinogen: 0.2   (Normal Range: 0-1) Nitrite: negative   (Normal Range: Negative) Leukocyte Esterace: small   (Normal Range: Negative)

## 2010-05-28 NOTE — Letter (Signed)
Summary: TO [facility] APPT  TO [facility] APPT   Imported By: Lind Guest 08/29/2009 10:58:26  _____________________________________________________________________  External Attachment:    Type:   Image     Comment:   External Document

## 2010-05-28 NOTE — Assessment & Plan Note (Signed)
Summary: ov   Vital Signs:  Patient profile:   35 year old female Height:      69 inches Weight:      173.75 pounds O2 Sat:      97 % on Room air Pulse rate:   88 / minute BP sitting:   130 / 78  (left arm) CC: throat pain and glands are swollen, and ears are stopped up, she states when she touches her chest it hurts a little.  room 2 Is Patient Diabetic? No   CC:  throat pain and glands are swollen, and ears are stopped up, and she states when she touches her chest it hurts a little.  room 2.  History of Present Illness: Pt presents today with c/o cough x 5 days.  Nonproductive.  The next day sore throat started.  Worse with swallowing.  Nasal congestion and post nasal drainage.  Also has sinus pressure now too. No fever or chills. Took Nyquil last night & helped her to sleep.   Allergies (verified): 1)  ! Amoxicillin  Review of Systems General:  Denies chills and fever. ENT:  Complains of nasal congestion, postnasal drainage, and sore throat; denies earache and sinus pressure. CV:  Denies chest pain or discomfort. Resp:  Complains of cough; denies shortness of breath and sputum productive.  Physical Exam  General:  Well-developed,well-nourished,in no acute distress; alert,appropriate and cooperative throughout examination Head:  Normocephalic and atraumatic without obvious abnormalities. No apparent alopecia or balding. Ears:  External ear exam shows no significant lesions or deformities.  Otoscopic examination reveals clear canals, tympanic membranes are intact bilaterally without bulging, retraction, inflammation or discharge. Hearing is grossly normal bilaterally. Nose:  External nasal examination shows no deformity or inflammation. Nasal mucosa are pink and moist without lesions or exudates. L maxillary sinus tenderness and R maxillary sinus tenderness.   Mouth:  good dentition, no posterior lymphoid hypertrophy, and no postnasal drip.  Bilat tonsils 1+, Rt with  pustules. Neck:  No deformities, masses, or tenderness noted. Lungs:  Normal respiratory effort, chest expands symmetrically. Lungs are clear to auscultation, no crackles or wheezes. Heart:  Normal rate and regular rhythm. S1 and S2 normal without gallop, murmur, click, rub or other extra sounds. Cervical Nodes:  shotty tender bilat submand nodes. Psych:  Cognition and judgment appear intact. Alert and cooperative with normal attention span and concentration. No apparent delusions, illusions, hallucinations   Impression & Recommendations:  Problem # 1:  TONSILLITIS, ACUTE (ICD-463) Assessment New  Problem # 2:  SINUSITIS, ACUTE (ICD-461.9) Assessment: New  Her updated medication list for this problem includes:    Cephalexin 500 Mg Tabs (Cephalexin) .Marland Kitchen... Take 1 three times a day x 10 days  Complete Medication List: 1)  Lamictal 100 Mg Tabs (Lamotrigine) .... One tab by mouth once daily 2)  Clonazepam 1 Mg Tabs (Clonazepam) .... One tab by mouth two times a day 3)  Cephalexin 500 Mg Tabs (Cephalexin) .... Take 1 three times a day x 10 days  Other Orders: Rapid Strep (16109)  Patient Instructions: 1)  Please schedule a follow-up appointment as needed. If you do not improve in 1 week I will need to see you again, sooner if you feel like you are worsening. 2)  Use Tylenol or Ibuprofen as needed for pain. 3)  Increase fluids. Prescriptions: CEPHALEXIN 500 MG TABS (CEPHALEXIN) take 1 three times a day x 10 days  #30 x 0   Entered and Authorized by:   Esperanza Sheets  PA   Signed by:   Esperanza Sheets PA on 12/10/2009   Method used:   Electronically to        [facility]. [phone]* (retail)       [address] [place], [place]  [address]       Ph: [phone]       Fax: [phone]   RxID:   [id number]   Laboratory Results  Date/Time Received: December 10, 2009 11:04 AM  Date/Time Reported: December 10, 2009 11:04 AM   Other Tests  Rapid Strep: negative

## 2010-08-08 ENCOUNTER — Telehealth: Payer: Self-pay | Admitting: Physician Assistant

## 2010-08-08 ENCOUNTER — Emergency Department (HOSPITAL_COMMUNITY)
Admission: EM | Admit: 2010-08-08 | Discharge: 2010-08-08 | Disposition: A | Discharge status: Home / Self Care | Payer: Managed Care, Other (non HMO) | Attending: Emergency Medicine | Admitting: Emergency Medicine

## 2010-08-08 DIAGNOSIS — F17200 Nicotine dependence, unspecified, uncomplicated: Secondary | ICD-10-CM | POA: Insufficient documentation

## 2010-08-08 DIAGNOSIS — F411 Generalized anxiety disorder: Secondary | ICD-10-CM | POA: Insufficient documentation

## 2010-08-08 NOTE — Telephone Encounter (Signed)
pls offer referral to psychology for assistance with this , since i have never seen her I cannot prescribe med, pls  Put her to the front staff for an appt to be set up when available

## 2010-08-09 NOTE — Telephone Encounter (Signed)
pls let pt know I see where she was in the Ed.Pls try and give this pt an appt in the next several weeks, since I have not seen her I cannot prescribe med for her

## 2010-08-16 NOTE — Telephone Encounter (Signed)
Called and voicemail box not set up

## 2010-08-19 NOTE — Telephone Encounter (Signed)
Patient has moved to [facility] will find a doctor down there

## 2010-09-13 NOTE — Discharge Summary (Signed)
[name], Kim Dunn            ACCOUNT NO.:  [id number]   MEDICAL RECORD NO.:  [id number]          PATIENT TYPE:  INP   LOCATION:  A427                          FACILITY:  [facility]   PHYSICIAN:  Tilda Burrow, M.D. DATE OF BIRTH:  March 17, 1976   DATE OF ADMISSION:  02/19/2006  DATE OF DISCHARGE:  10/27/2007LH                                 DISCHARGE SUMMARY   ADMISSION DIAGNOSIS:  1. Uterine fibroids, severe anemia, corrected.  2. Abdominal wall laxity.   DISCHARGE DIAGNOSES:  1. Uterine fibroids, severe anemia, corrected.  2. Abdominal wall laxity.  3. Pelvic relaxation.  4. Endometriosis.   PROCEDURE:  1. Total abdominal hysterectomy and left salpingo-oophorectomy.  2. Partial panniculectomy.   DISCHARGE MEDICATIONS:  1. Tylox one to two q.4 h. p.r.n. pain #30.  2. Celexa 20 mg p.o. daily.  3. __________ one p.o. daily.  4. Ambien CR p.r.n. insomnia.  5. Stool softener of choice daily p.r.n. constipation.   FOLLOW UP:  Five days for partial staple removal and Jackson-Pratt drain  removal.   HOSPITAL COURSE:  A 35 year old gravid 1, para 1, admitted for hysterectomy  for fibroids with pseudo anemia controlled recently with Megace after  identifying a hemoglobin of 3.5, hematocrit 14.5.  The patient was admitted,  underwent hysterectomy and left salpingo-oophorectomy, partial  panniculectomy as described in the operative report with lots of abdominal  wall and generalized pelvic laxity addressed with the surgery as documented  in operative note.  A Jackson-Pratt drain was placed in the subcutaneous  space.  Postoperatively the patient had a postoperative hemoglobin of 11.5  compared to admitting hemoglobin of 12.7.  Blood type was A positive.  No  transfusions were required.  The patient was stable after two days for  outpatient management with drain to be removed on followup visit next week  in the office.  She has excellent support from her husband and routine post  surgical instructions regarding driving, fever, constipation or bleeding.      Tilda Burrow, M.D.  Electronically Signed     JVF/MEDQ  D:  02/21/2006  T:  02/23/2006  Job:  [id number]

## 2010-09-13 NOTE — H&P (Signed)
NAME:  Kim Dunn, PICCINI            ACCOUNT NO.:  [id number]   MEDICAL RECORD NO.:  [id number]          PATIENT TYPE:  OBV   LOCATION:  A428                          FACILITY:  [facility]   PHYSICIAN:  Tilda Burrow, M.D. DATE OF BIRTH:  05-Jan-1976   DATE OF ADMISSION:  DATE OF DISCHARGE:  LH                                HISTORY & PHYSICAL   HISTORY OF PRESENT ILLNESS:  Kim Dunn is a 35 year old patient in with  malaise and feeling bad.  Upon assessment, her hemoglobin is 3.5, and  hematocrit is 14.5.  After careful discussion and reviewing with Kim Dunn and  her husband today, Kim Dunn did not seem that her periods are that heavy, but  husband states that he has noticed large clots with pads and also clots in  the bathtub after she has showered.  Bimanual exams reveals uterus of normal  shape, size and contour.  No adnexal masses or tenderness.  Rectal exam is  negative.  Heme-occult is negative.   MEDICAL HISTORY:  Negative with the exception of a broken wrist.  Surgical  history is negative.   MEDICATIONS:  She was put on iron yesterday, and she is on Darvocet for  headaches right before her menses.   PHYSICAL EXAMINATION:  VITAL SIGNS:  Weight is 175, blood pressure 112/60.  Hemoglobin is 3.5, hematocrit 14.5.  I will forward a copy of patient's labs  with her.  PELVIC:  Exam is normal per dictation.   IMPRESSION:  Severe anemia related to heavy menses.   PLAN:  We are going to admit, transfuse 4 units of blood, and Dr. Despina Hidden  or  Emelda Fear will continue to follow the patient in the hospital.      Zerita Boers, N.M.      Tilda Burrow, M.D.  Electronically Signed    DL/MEDQ  D:  16/01/9603  T:  01/27/2006  Job:  [id number]

## 2010-09-13 NOTE — H&P (Signed)
NAMEJALEEA, ALESI            ACCOUNT NO.:  [id number]   MEDICAL RECORD NO.:  [id number]          PATIENT TYPE:  AMB   LOCATION:  DAY                           FACILITY:  [facility]   PHYSICIAN:  Tilda Burrow, M.D. DATE OF BIRTH:  [birth year]   DATE OF ADMISSION:  02/18/2006  DATE OF DISCHARGE:  LH                                HISTORY & PHYSICAL   ADMISSION DIAGNOSES:  1. Uterine fibroids.  2. Severe anemia, corrected.   HISTORY OF PRESENT ILLNESS:  This 35 year old female, gravida 1, para 1 with  no plans for future child bearing is admitted at this time for abdominal  hysterectomy.  She has been seen in our office and found to have symptomatic  uterine fibroid.  She has had a progressively heavy period and was found to  have a hemoglobin of 3-1/2, hematocrit 14.5.  She did not seem to perceive  her periods as heavy, but noticed fist sized clots in the bathtub after she  showered.  The uterus was relatively normal in size, but ultrasound shows a  6.5x6.4x6.7 cm fibroid located within the enlarged uterus which contains  multiple fibroids, some of which are submucosal in location.  Left and right  ovary are normal.  Pap smears were done February 11, 2006, and were  reportedly normal.  She is admitted for hysterectomy.  Preservation of  ovaries is planned.   PAST MEDICAL HISTORY:  Anemia, otherwise, negative.   PAST SURGICAL HISTORY:  Negative.   ALLERGIES:  None.   MEDICATIONS:  Iron and vitamins only.   PHYSICAL EXAMINATION:  VITAL SIGNS:  Height 5 feet, 9 inches, weight 164.8,  blood pressure 120/70, pulse 80.  HEENT:  Pupils equal, round and reactive.  NECK:  Supple.  CHEST:  Clear to auscultation.  ABDOMEN:  Nontender.  BREASTS:  Deferred.  EXTERNAL GENITALIA:  Multiparous with moderate bleeding present.  UTERUS/VAGINAL:  Physiologic secretions.  Cervix multiparous, nontender,  nonpurulent uterus, 10-12 weeks size.  Adnexa nontender.   IMPRESSION:  Uterine  fibroids, menorrhagia and recovered anemia.   PLAN:  Abdominal hysterectomy with preservation of ovaries,  __________removal of cervix on February 19, 2006.      Tilda Burrow, M.D.  Electronically Signed     JVF/MEDQ  D:  02/18/2006  T:  02/19/2006  Job:  [id number]   cc:   [facility]   Kingsley Callander. Ouida Sills, MD  Fax: [phone]

## 2010-09-13 NOTE — Op Note (Signed)
NAMEALIYANAH, ROZAS            ACCOUNT NO.:  [id number]   MEDICAL RECORD NO.:  [id number]          PATIENT TYPE:  INP   LOCATION:  A427                          FACILITY:  [facility]   PHYSICIAN:  Tilda Burrow, M.D. DATE OF BIRTH:  Oct 17, 1975   DATE OF PROCEDURE:  02/19/2006  DATE OF DISCHARGE:                                 OPERATIVE REPORT   ADMISSION DIAGNOSIS:  Uterine fibroids, severe anemia corrected.   POSTOPERATIVE DIAGNOSIS:  Uterine fibroids, severe anemia corrected, also  generalized pelvic and abdominal wall laxity.  Endometriosis.   OPERATION PERFORMED:  Total abdominal hysterectomy, left salpingo-  oophorectomy and partial panniculectomy.   SURGEON:  Tilda Burrow, M.D.   ASSISTANTAmie Critchley, CST   ANESTHESIA:  General.   COMPLICATIONS:  None.   FINDINGS:  Generalized abdominal and pelvic laxity.  Huge fibroid uterus.   DESCRIPTION OF PROCEDURE:  The patient was taken to the operating room,  prepped and draped in supine position with Foley catheter in place, vaginal  prepping having been performed.  Foley catheter was left in place. The  patient received antibiotic prophylaxis and Flowtron DVT prophylaxis.   Pfannenstiel type incision was performed. It was noted during the  preparation, the tremendous abdominal wall laxity that the patient had.  As  per our discussions, we proceeded with panniculectomy to improve things. A  Pelosi incision was selected.   We first proceeded by making a semicircular incision from anterior superior  iliac crest on one side to the other and removed a three inch wide ellipse  of skin and underlying fatty tissue down to Scarpa's fascia.  This was  removed with ease, resulting in a symmetric lower abdominal contour.  The  opening allowed a midline peritoneal entry with the fascia opened in the  midline sufficiently to identify and extract the uterus.  A soft flexible  retractor was positioned in place, the large 12 to 14  cm fibroid uterus,  flipped into the incision.  It was noted that she had lots of endometriosis  blood in the pelvis.  The left ovary was inspected and showed evidence of  endometriosis on the surface with several scarred spots.  The left ovary was  quite large at 4 cm length.  Additionally there was a paratubal cyst of 2 to  3 cm size on the left side.  It was felt that the left ovary would need to  be extracted.  The right ovary by comparison was visually normal without any  visible endometriosis on the surfaces.   The round ligaments were atrophic but were taken down bilaterally by using  interrupted suture of 0 chromic.  Bladder flap was developed anteriorly,  pushed inferiorly with ease, with the utero-ovarian ligaments on each side  clamped, cut and suture ligated with 0 chromic and Kelly hemostats.  At this  point the uterine vessels could be skeletonized and on the left side, curved  Heaney clamp was placed across and Kelly clamp placed for backbleeding.  The  uterine vessels clamped, cut and suture ligated with 0 chromic.  The upper  cardinal ligaments were clamped,  cut and suture ligated with straight Heaney  clamp, knife dissection and 0 chromic suture ligatures.  The body of the  uterus was amputated off the lower uterine segment.  The pelvic laxity was  rather pronounced and it was felt that efforts should be made to correct  this.  We continued with the plans to remove the lower uterine segment and  cervix as per patient request.  The lower cardinal ligaments were clamped,  cut and suture ligated with 0 chromic on either side.  At this time a stab  incision in the anterior cervicovaginal fornix allowed circumscribing the  cervix off of the cuff.  The cuff was grabbed with all of Kocher clamps at  the four quadrants and each lateral vaginal angle attached to lower cardinal  ligaments using Aldridge stitch.  The pelvic laxity allowed the vaginal apex  to drop almost to the  floor so efforts were made to reconnect the pelvic  structures, so first a figure-of-eight suture was placed in the posterior  vaginal wall to pull together the posterior vaginal tissues, reducing the  distance between the cardinal ligaments.  Anteriorly the upper 1/4 of the  vagina could be easily identified separate from the bladder flap which had  been pushed inferiorly.  A 1.5 cm wide x 1.5 cm deep ellipse of skin and  connective tissue was removed from the anterior vaginal wall and this pulled  back together tightening the anterior support beneath the bladder for a  distance of almost 2 cm down the anterior vaginal wall.  This area was  pulled back together using interrupted sutures of 90 chromic.  The remaining  vaginal cuff was closed side to side with running 0 chromic sutures.  The  ultimate closure of the cuff was quite good and allowed for improved  midpelvis support.  There was some oozing from the anterior wedge of tissue  on the anterior vaginal wall that required some attention. It was placed on  traction and while being careful to hold the bladder out of the way, point  cautery was used on the vaginal cuff margin.  Satisfactory hemostasis was  achieved.   Pelvic irrigation followed and bladder flap reapproximation.  The left tube  and ovary were inspected and again confirmed as needing to be removed due to  extensive endometriosis.  Being careful to ensure that pedicle was not  placing the ureter at risk, we cross-clamped the pedicle just above the tube  and ovary, removed the left tube and ovary.  0 chromic suture ligature of  the pedicle was performed. The right side was inspected again and found  without endometriosis.  Bowel was repositioned in its normal position.  The  anterior peritoneum closed with 2-0 chromic, the fascia reapproximated with  running 0 Vicryl and subcutaneous fatty space drained with a flat Al Pimple drain which was allowed to exit through a  stab incision in the left  lower quadrant of the skin, just in front of the anterior superior iliac  crest on the left.   This was sewn in place and then the remaining potential space obliterated  with interrupted 2-0 plain sutures and followed by staple closure of the  skin.  The estimated blood loss for this procedure was 100 mL.      Tilda Burrow, M.D.  Electronically Signed     JVF/MEDQ  D:  02/19/2006  T:  02/20/2006  Job:  [id number]   cc:   Kingsley Callander. Ouida Sills, MD  Fax: [phone]

## 2013-12-24 ENCOUNTER — Emergency Department (HOSPITAL_COMMUNITY)
Admission: EM | Admit: 2013-12-24 | Discharge: 2013-12-25 | Disposition: A | Discharge status: Home / Self Care | Payer: Managed Care, Other (non HMO) | Attending: Emergency Medicine | Admitting: Emergency Medicine

## 2013-12-24 ENCOUNTER — Encounter (HOSPITAL_COMMUNITY): Payer: Self-pay | Admitting: Emergency Medicine

## 2013-12-24 DIAGNOSIS — Z880 Allergy status to penicillin: Secondary | ICD-10-CM | POA: Insufficient documentation

## 2013-12-24 DIAGNOSIS — F17200 Nicotine dependence, unspecified, uncomplicated: Secondary | ICD-10-CM | POA: Insufficient documentation

## 2013-12-24 DIAGNOSIS — R4182 Altered mental status, unspecified: Secondary | ICD-10-CM | POA: Insufficient documentation

## 2013-12-24 LAB — CBC
HEMATOCRIT: 48 % — AB (ref 36.0–46.0)
HEMOGLOBIN: 17.5 g/dL — AB (ref 12.0–15.0)
MCH: 35.7 pg — ABNORMAL HIGH (ref 26.0–34.0)
MCHC: 36.5 g/dL — AB (ref 30.0–36.0)
MCV: 98 fL (ref 78.0–100.0)
Platelets: 178 10*3/uL (ref 150–400)
RBC: 4.9 MIL/uL (ref 3.87–5.11)
RDW: 13.7 % (ref 11.5–15.5)
WBC: 10.1 10*3/uL (ref 4.0–10.5)

## 2013-12-24 LAB — COMPREHENSIVE METABOLIC PANEL
ALT: 11 U/L (ref 0–35)
AST: 22 U/L (ref 0–37)
Albumin: 4.5 g/dL (ref 3.5–5.2)
Alkaline Phosphatase: 68 U/L (ref 39–117)
Anion gap: 19 — ABNORMAL HIGH (ref 5–15)
BILIRUBIN TOTAL: 0.3 mg/dL (ref 0.3–1.2)
BUN: 12 mg/dL (ref 6–23)
CO2: 20 meq/L (ref 19–32)
Calcium: 9.6 mg/dL (ref 8.4–10.5)
Chloride: 99 mEq/L (ref 96–112)
Creatinine, Ser: 1.03 mg/dL (ref 0.50–1.10)
GFR, EST AFRICAN AMERICAN: 80 mL/min — AB (ref >90)
GFR, EST NON AFRICAN AMERICAN: 69 mL/min — AB (ref >90)
GLUCOSE: 95 mg/dL (ref 70–99)
POTASSIUM: 3.7 meq/L (ref 3.7–5.3)
Sodium: 138 mEq/L (ref 137–147)
TOTAL PROTEIN: 7.6 g/dL (ref 6.0–8.3)

## 2013-12-24 LAB — CBG MONITORING, ED: GLUCOSE-CAPILLARY: 91 mg/dL (ref 70–99)

## 2013-12-24 NOTE — ED Notes (Signed)
Registration called this nurse and stated that this pt was dropped off by police after being found in laying out in the front yard. Pt denies this. Pt very confused and unable to answer questions completely. Pt does admit to drinking alcohol and that she feels like her hemoglobin is low. Pt very lethargic.

## 2013-12-24 NOTE — ED Provider Notes (Signed)
CSN: [id number]     Arrival date & time 12/24/13  2218 History  This chart was scribed for Vida Roller, MD by Modena Jansky, ED Scribe. This patient was seen in room APA07/APA07 and the patient's care was started at 11:19 PM.  Chief Complaint  Patient presents with  . Altered Mental Status  Level 5 Caveat due to AMS  The history is provided by the police and the patient. No language interpreter was used.   HPI Comments: Kim Dunn is a 38 y.o. female who presents to the Emergency Department complaining of altered mental status. Pt was found in the yard laying down with decreased level of consciousness. She was dropped off by police and initially confused. Endorsed alcohol intake. Pt reports that she is homeless and staying at service center. No other information available.  She continues to say that she just is trying to sleep and that she has a headache.  History reviewed. No pertinent past medical history. History reviewed. No pertinent past surgical history. No family history on file. History  Substance Use Topics  . Smoking status: Current Every Day Smoker  . Smokeless tobacco: Not on file  . Alcohol Use: Yes   OB History   Grav Para Term Preterm Abortions TAB SAB Ect Mult Living                 Review of Systems  Unable to perform ROS: Mental status change     Allergies  Amoxicillin  Home Medications   Prior to Admission medications   Not on File   BP 133/99  Pulse 89  Temp(Src) 98.2 F (36.8 C) (Oral)  Resp 18  Ht  (1.753 m)  Wt 145 lb (65.772 kg)  BMI 21.40 kg/m2  SpO2 92% Physical Exam  Nursing note and vitals reviewed. Constitutional: She appears well-developed and well-nourished. No distress.  HENT:  Head: Normocephalic and atraumatic.  Mouth/Throat: Oropharynx is clear and moist. No oropharyngeal exudate.  Eyes: Conjunctivae and EOM are normal. Pupils are equal, round, and reactive to light. Right eye exhibits no discharge. Left eye  exhibits no discharge. No scleral icterus.  Neck: Normal range of motion. Neck supple. No JVD present. No tracheal deviation present. No thyromegaly present.  Cardiovascular: Normal rate, regular rhythm, normal heart sounds and intact distal pulses.  Exam reveals no gallop and no friction rub.   No murmur heard. Pulmonary/Chest: Effort normal and breath sounds normal. No respiratory distress. She has no wheezes. She has no rales.  Abdominal: Soft. Bowel sounds are normal. She exhibits no distension and no mass. There is no tenderness.  Musculoskeletal: Normal range of motion. She exhibits no edema and no tenderness.  Lymphadenopathy:    She has no cervical adenopathy.  Neurological: Coordination normal.  Pt is somnolent, arousable and follows commands - oriented when asked specific questions but states she wants to sleep and rolls over to fall asleep  Skin: Skin is warm and dry. No rash noted. No erythema.  Psychiatric: She has a normal mood and affect. Her behavior is normal.    ED Course  Procedures (including critical care time) DIAGNOSTIC STUDIES: Oxygen Saturation is 92% on RA, normal by my interpretation.    COORDINATION OF CARE: 11:23 PM- Pt advised of plan for treatment which includes labs and pt agrees.  Labs Review Labs Reviewed  CBC - Abnormal; Notable for the following:    Hemoglobin 17.5 (*)    HCT 48.0 (*)    MCH 35.7 (*)  MCHC 36.5 (*)    All other components within normal limits  COMPREHENSIVE METABOLIC PANEL - Abnormal; Notable for the following:    GFR calc non Af Amer 69 (*)    GFR calc Af Amer 80 (*)    Anion gap 19 (*)    All other components within normal limits  URINALYSIS, ROUTINE W REFLEX MICROSCOPIC - Abnormal; Notable for the following:    Specific Gravity, Urine >1.030 (*)    Ketones, ur TRACE (*)    All other components within normal limits  CBG MONITORING, ED    Imaging Review Ct Head Wo Contrast  12/25/2013   CLINICAL DATA:  Headaches,  confusion, and combative.  EXAM: CT HEAD WITHOUT CONTRAST  TECHNIQUE: Contiguous axial images were obtained from the base of the skull through the vertex without intravenous contrast.  COMPARISON:  None.  FINDINGS: Ventricles and sulci appear symmetrical. No mass effect or midline shift. No abnormal extra-axial fluid collections. Gray-white matter junctions are distinct. Basal cisterns are not effaced. No evidence of acute intracranial hemorrhage. No depressed skull fractures. Visualized paranasal sinuses and mastoid air cells are not opacified.  IMPRESSION: No acute intracranial abnormalities.   Electronically Signed   By: Burman Nieves M.D.   On: 12/25/2013 00:45     EKG Interpretation   Date/Time:  Saturday December 24 2013 22:32:44 EDT Ventricular Rate:  104 PR Interval:  169 QRS Duration: 80 QT Interval:  346 QTC Calculation: 455 R Axis:   74 Text Interpretation:  Sinus tachycardia Biatrial enlargement Baseline  wander in lead(s) V4 Abnormal ekg No old tracing to compare Confirmed by  Veleka Djordjevic  MD, Demi Trieu ([id number]) on 12/24/2013 11:54:43 PM      MDM   Final diagnoses:  Altered mental status, unspecified altered mental status type    The pt has altered MS, it may be all alcohol, consider sleep deprivation as well, other sources would include intracranial pathology as she has a headache - she is non focal and other than appearing somnolent is non focal.  Ct head, labs, monitor, recheck  0700 AM - Over the night the pt slept - she had normal labs and CT head without other acute findings and was able to ambulate to the bathroom, she is now awake and alert and at abaseline and has no c/o.    I personally performed the services described in this documentation, which was scribed in my presence. The recorded information has been reviewed and is accurate.       Vida Roller, MD 12/25/13 [phone]

## 2013-12-25 ENCOUNTER — Emergency Department (HOSPITAL_COMMUNITY): Payer: Managed Care, Other (non HMO)

## 2013-12-25 LAB — URINALYSIS, ROUTINE W REFLEX MICROSCOPIC
BILIRUBIN URINE: NEGATIVE
Glucose, UA: NEGATIVE mg/dL
HGB URINE DIPSTICK: NEGATIVE
Leukocytes, UA: NEGATIVE
NITRITE: NEGATIVE
PROTEIN: NEGATIVE mg/dL
Specific Gravity, Urine: >1.03 — ABNORMAL HIGH (ref 1.005–1.030)
UROBILINOGEN UA: 0.2 mg/dL (ref 0.0–1.0)
pH: 5 (ref 5.0–8.0)

## 2013-12-25 NOTE — ED Notes (Signed)
Pt. Became combative at CT, security called. Pt. Able to be calmed through verbal redirection.

## 2013-12-25 NOTE — ED Notes (Signed)
Pt. Ambulated to bathroom. Requested pt. To provide urine sample. When pt. Exited the bathroom she stated that she threw the cup in the trash. Pt. Ambulated with standby assist back to room. Pt. States "leave me alone, I just want to sleep"

## 2014-02-25 ENCOUNTER — Encounter (HOSPITAL_COMMUNITY): Payer: Self-pay | Admitting: Emergency Medicine

## 2014-02-25 ENCOUNTER — Emergency Department (HOSPITAL_COMMUNITY): Payer: Managed Care, Other (non HMO)

## 2014-02-25 ENCOUNTER — Emergency Department (HOSPITAL_COMMUNITY)
Admission: EM | Admit: 2014-02-25 | Discharge: 2014-02-25 | Disposition: A | Discharge status: Home / Self Care | Payer: Managed Care, Other (non HMO) | Attending: Emergency Medicine | Admitting: Emergency Medicine

## 2014-02-25 DIAGNOSIS — W19XXXA Unspecified fall, initial encounter: Secondary | ICD-10-CM

## 2014-02-25 DIAGNOSIS — F10121 Alcohol abuse with intoxication delirium: Secondary | ICD-10-CM | POA: Insufficient documentation

## 2014-02-25 DIAGNOSIS — S199XXA Unspecified injury of neck, initial encounter: Secondary | ICD-10-CM | POA: Insufficient documentation

## 2014-02-25 DIAGNOSIS — M542 Cervicalgia: Secondary | ICD-10-CM

## 2014-02-25 DIAGNOSIS — R4182 Altered mental status, unspecified: Secondary | ICD-10-CM

## 2014-02-25 DIAGNOSIS — Y990 Civilian activity done for income or pay: Secondary | ICD-10-CM | POA: Insufficient documentation

## 2014-02-25 DIAGNOSIS — S0990XA Unspecified injury of head, initial encounter: Secondary | ICD-10-CM | POA: Insufficient documentation

## 2014-02-25 DIAGNOSIS — Y9389 Activity, other specified: Secondary | ICD-10-CM | POA: Insufficient documentation

## 2014-02-25 DIAGNOSIS — F10921 Alcohol use, unspecified with intoxication delirium: Secondary | ICD-10-CM

## 2014-02-25 DIAGNOSIS — S098XXA Other specified injuries of head, initial encounter: Secondary | ICD-10-CM

## 2014-02-25 DIAGNOSIS — I10 Essential (primary) hypertension: Secondary | ICD-10-CM | POA: Insufficient documentation

## 2014-02-25 DIAGNOSIS — Z720 Tobacco use: Secondary | ICD-10-CM | POA: Insufficient documentation

## 2014-02-25 DIAGNOSIS — Y9289 Other specified places as the place of occurrence of the external cause: Secondary | ICD-10-CM | POA: Insufficient documentation

## 2014-02-25 DIAGNOSIS — W01198A Fall on same level from slipping, tripping and stumbling with subsequent striking against other object, initial encounter: Secondary | ICD-10-CM | POA: Insufficient documentation

## 2014-02-25 HISTORY — DX: Essential (primary) hypertension: I10

## 2014-02-25 LAB — CBC WITH DIFFERENTIAL/PLATELET
BASOS ABS: 0 10*3/uL (ref 0.0–0.1)
Basophils Relative: 0 % (ref 0–1)
EOS ABS: 0.1 10*3/uL (ref 0.0–0.7)
Eosinophils Relative: 1 % (ref 0–5)
HCT: 48.3 % — ABNORMAL HIGH (ref 36.0–46.0)
Hemoglobin: 17.3 g/dL — ABNORMAL HIGH (ref 12.0–15.0)
Lymphocytes Relative: 23 % (ref 12–46)
Lymphs Abs: 2.3 10*3/uL (ref 0.7–4.0)
MCH: 34.7 pg — AB (ref 26.0–34.0)
MCHC: 35.8 g/dL (ref 30.0–36.0)
MCV: 97 fL (ref 78.0–100.0)
Monocytes Absolute: 0.4 10*3/uL (ref 0.1–1.0)
Monocytes Relative: 4 % (ref 3–12)
Neutro Abs: 7.3 10*3/uL (ref 1.7–7.7)
Neutrophils Relative %: 72 % (ref 43–77)
PLATELETS: 207 10*3/uL (ref 150–400)
RBC: 4.98 MIL/uL (ref 3.87–5.11)
RDW: 13.1 % (ref 11.5–15.5)
WBC: 10.1 10*3/uL (ref 4.0–10.5)

## 2014-02-25 LAB — URINALYSIS, ROUTINE W REFLEX MICROSCOPIC
Bilirubin Urine: NEGATIVE
Glucose, UA: NEGATIVE mg/dL
HGB URINE DIPSTICK: NEGATIVE
Ketones, ur: NEGATIVE mg/dL
LEUKOCYTES UA: NEGATIVE
Nitrite: NEGATIVE
PH: 5.5 (ref 5.0–8.0)
Protein, ur: NEGATIVE mg/dL
Specific Gravity, Urine: <1.005 — ABNORMAL LOW (ref 1.005–1.030)
Urobilinogen, UA: 0.2 mg/dL (ref 0.0–1.0)

## 2014-02-25 LAB — BASIC METABOLIC PANEL
ANION GAP: 16 — AB (ref 5–15)
BUN: 7 mg/dL (ref 6–23)
CALCIUM: 9 mg/dL (ref 8.4–10.5)
CO2: 24 mEq/L (ref 19–32)
Chloride: 102 mEq/L (ref 96–112)
Creatinine, Ser: 0.75 mg/dL (ref 0.50–1.10)
GFR calc non Af Amer: >90 mL/min (ref >90)
GLUCOSE: 99 mg/dL (ref 70–99)
Potassium: 3.8 mEq/L (ref 3.7–5.3)
SODIUM: 142 meq/L (ref 137–147)

## 2014-02-25 LAB — RAPID URINE DRUG SCREEN, HOSP PERFORMED
Amphetamines: NOT DETECTED
Barbiturates: NOT DETECTED
Benzodiazepines: NOT DETECTED
Cocaine: NOT DETECTED
Opiates: NOT DETECTED
Tetrahydrocannabinol: NOT DETECTED

## 2014-02-25 LAB — ETHANOL: ALCOHOL ETHYL (B): 405 mg/dL — AB (ref 0–11)

## 2014-02-25 NOTE — ED Notes (Signed)
EMS reports pt fell in a parking lot.  Pt denies any pain, dizziness, or lightheadedness.  EMS reports a police officer witnessed pt stumble across the parking lot and fall face first.  Pt denies any pain but says wants to have her bp checked.  [employer] was on scene.  Pt reports to drinking etoh , states had 1 beer.  Pt alert, slurred speech, oriented x 4.

## 2014-02-25 NOTE — ED Notes (Signed)
Pt fell this morning.  States she has been up all night at a hotel drinking.  Drank 2 beers.  Denies any drug use.  Pt very drowsy when speaking with her.

## 2014-02-25 NOTE — ED Provider Notes (Signed)
CSN: [id number]     Arrival date & time 02/25/14  1149 History  This chart was scribed for Kim Dunn Kim Auguste, MD by Roxy Cedarhandni Bhalodia, ED Scribe. This patient was seen in room [facility] and the patient's care was started at 12:19 PM.   Chief Complaint  Patient presents with  . Fall  . Alcohol Intoxication   Patient is a 38 y.o. female presenting with fall and intoxication. The history is provided by the patient. No language interpreter was used.  Fall  Alcohol Intoxication   HPI Comments: Steele BergCandice L Dunn is a 38 y.o. 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 Admits to 1-2 drinks of alcohol. Thinks she felt nausea, next thing she knew she was being brought into ED. She was witnessed by police to fall in parking lot and hit her head on the ground unknown if brief LOC. Denies SI/HI or request for detox. Pt denies pain, weak, numb, or other concerns.   Past Medical History  Diagnosis Date  . Hypertension    History reviewed. No pertinent past surgical history. No family history on file. History  Substance Use Topics  . Smoking status: Current Every Day Smoker  . Smokeless tobacco: Not on file  . Alcohol Use: Yes   OB History    No data available     Review of Systems  10 Systems reviewed and are negative for acute change except as noted in the HPI.  Allergies  Review of patient's allergies indicates no known allergies.  Home Medications   Prior to Admission medications   Not on File   Triage Vitals: BP 168/120  Pulse 88  Temp(Src) 98 F (36.7 C) (Oral)  Resp 20  Ht 5\' 9"  (1.753 Dunn)  Wt 149 lb (67.586 kg)  BMI 21.99 kg/m2  SpO2 96%  Physical Exam  Constitutional:  Awake, alert, nontoxic appearance with baseline speech for patient.  HENT:  Head: Atraumatic.  Mouth/Throat: No oropharyngeal exudate.  Eyes: EOM are normal. Pupils are equal, round, and reactive to light. Right eye exhibits no discharge. Left eye exhibits no discharge.   Neck: Neck supple.  Cardiovascular: Normal rate and regular rhythm.   No murmur heard. Pulmonary/Chest: Effort normal and breath sounds normal. No stridor. No respiratory distress. She has no wheezes. She has no rales. She exhibits no tenderness.  Abdominal: Soft. Bowel sounds are normal. She exhibits no mass. There is no tenderness. There is no rebound.  Musculoskeletal: She exhibits no tenderness.  Baseline ROM, moves extremities with no obvious new focal weakness.  Lymphadenopathy:    She has no cervical adenopathy.  Neurological: She is alert.  GCS 14 mild confusion oriented to person and place; Awake, alert, cooperative and aware of situation; motor strength 5/5 bilaterally; sensation normal to light touch bilaterally; peripheral visual fields full to confrontation; no facial asymmetry; tongue midline; major cranial nerves appear intact; no pronator drift, finger to nose bilaterally minimal dysmetria, gait unassisted with minimal ataxia. Nystagmus present.  Skin: No rash noted.  Psychiatric: She has a normal mood and affect.  Nursing note and vitals reviewed.  ED Course  Procedures (including critical care time)  DIAGNOSTIC STUDIES: Oxygen Saturation is 96% on RA, normal by my interpretation.    COORDINATION OF CARE: 12:24 PM- Discussed plans to order diagnostic CT of patient's head and neck. Patient / Family / Caregiver understand and agree with initial ED impression and plan with expectations set for ED visit.  The  patient appears reasonably stabilized for transfer considering the current resources, flow, and capabilities available in the ED at this time, and I doubt any other [facility] requiring further screening and/or treatment in the ED prior to transfer.Pt agrees to transfer for imaging since no CT at [facility]. 1240  CT repaired in [facility] ED so transfer for imaging cancelled.  Patient walking independently with minimal ataxia and only slightly slurred speech, patient aware I'Dunn still  concerned about her safety due to alcohol intoxication and do not feel comfortable discharging her yet without adult supervision. Pt understands and agrees to call for ride home. 1440   Pt states she has a ride home from her mom. Patient reiterates she is not suicidal homicidal or hallucinating she is not here for detox and does not want alcohol detox. 1455  Labs Review Labs Reviewed  CBC WITH DIFFERENTIAL - Abnormal; Notable for the following:    Hemoglobin 17.3 (*)    HCT 48.3 (*)    MCH 34.7 (*)    All other components within normal limits  BASIC METABOLIC PANEL - Abnormal; Notable for the following:    Anion gap 16 (*)    All other components within normal limits  ETHANOL - Abnormal; Notable for the following:    Alcohol, Ethyl (B) 405 (*)    All other components within normal limits  URINALYSIS, ROUTINE W REFLEX MICROSCOPIC - Abnormal; Notable for the following:    Color, Urine STRAW (*)    Specific Gravity, Urine <1.005 (*)    All other components within normal limits  URINE RAPID DRUG SCREEN (HOSP PERFORMED)   Imaging Review Ct Head Wo Contrast  02/25/2014   CLINICAL DATA:  Patient fell with loss of consciousness.  Nausea.  EXAM: CT HEAD WITHOUT CONTRAST  CT CERVICAL SPINE WITHOUT CONTRAST  TECHNIQUE: Multidetector CT imaging of the head and cervical spine was performed following the standard protocol without intravenous contrast. Multiplanar CT image reconstructions of the cervical spine were also generated.  COMPARISON:  None.  FINDINGS: CT HEAD FINDINGS  There is some degradation due to motion artifact. The ventricles are normal in size and configuration. There is no mass, hemorrhage, extra-axial fluid collection, or midline shift. Gray-white compartments are normal. No acute infarct apparent the bony calvarium appears intact. The mastoid air cells are clear.  CT CERVICAL SPINE FINDINGS  There is no fracture or spondylolisthesis. Prevertebral soft tissues and predental space  regions are normal. There is a small focus of calcification in the posterior longitudinal ligament at C5-6. There is no appreciable disc space narrowing. No disc extrusion or stenosis.  IMPRESSION: CT head: There is some image degradation due to motion artifact. Given this proviso, No abnormality is appreciable. No intracranial mass, hemorrhage, or extra-axial fluid. No acute infarct apparent.  CT cervical spine: No fracture or spondylolisthesis. No appreciable arthropathy. Small focus of calcification in the posterior ligament at C5-6 noted.   Electronically Signed   By: Bretta Bang Dunn.D.   On: 02/25/2014 14:13   Ct Cervical Spine Wo Contrast  02/25/2014   CLINICAL DATA:  Patient fell with loss of consciousness.  Nausea.  EXAM: CT HEAD WITHOUT CONTRAST  CT CERVICAL SPINE WITHOUT CONTRAST  TECHNIQUE: Multidetector CT imaging of the head and cervical spine was performed following the standard protocol without intravenous contrast. Multiplanar CT image reconstructions of the cervical spine were also generated.  COMPARISON:  None.  FINDINGS: CT HEAD FINDINGS  There is some degradation due to motion artifact. The ventricles are  normal in size and configuration. There is no mass, hemorrhage, extra-axial fluid collection, or midline shift. Gray-white compartments are normal. No acute infarct apparent the bony calvarium appears intact. The mastoid air cells are clear.  CT CERVICAL SPINE FINDINGS  There is no fracture or spondylolisthesis. Prevertebral soft tissues and predental space regions are normal. There is a small focus of calcification in the posterior longitudinal ligament at C5-6. There is no appreciable disc space narrowing. No disc extrusion or stenosis.  IMPRESSION: CT head: There is some image degradation due to motion artifact. Given this proviso, No abnormality is appreciable. No intracranial mass, hemorrhage, or extra-axial fluid. No acute infarct apparent.  CT cervical spine: No fracture or  spondylolisthesis. No appreciable arthropathy. Small focus of calcification in the posterior ligament at C5-6 noted.   Electronically Signed   By: Bretta BangWilliam  Woodruff Dunn.D.   On: 02/25/2014 14:13     EKG Interpretation   Date/Time:  Saturday February 25 2014 11:58:07 EDT Ventricular Rate:  78 PR Interval:  133 QRS Duration: 88 QT Interval:  412 QTC Calculation: 469 R Axis:   81 Text Interpretation:  Sinus rhythm Consider left ventricular hypertrophy  No previous ECGs available Confirmed by [facility]Deliah Strehlow  MD, Jonny RuizJOHN ([id number]) on  02/25/2014 12:40:27 PM     MDM    Final diagnoses:  Blunt head trauma, initial encounter  Altered mental status  Neck pain  Fall  Alcohol intoxication, with delirium    I doubt any other [facility] precluding discharge at this time including, but not necessarily limited to the following:[facility].  I personally performed the services described in this documentation, which was scribed in my presence. The recorded information has been reviewed and is accurate.  Kim Dunn Kandance Yano, MD 02/26/14 2216

## 2014-02-25 NOTE — ED Notes (Signed)
Pt left with discharge papers and refused to have vitals re-checked. Pt also refused to e-sign discharge. This nurse did review d/c instructions with the patient and her family friend. Pt left with family friend and this friend told nurse that mother was waiting in the car. Friend stated that she and pt's mother would watch and observe pt for at least the next 4 hours. Pt ambulated without difficulty out the the department.

## 2014-02-25 NOTE — Discharge Instructions (Signed)
For the next 24 hours stay with a responsible adult to observe/supervise you.  Alcohol Intoxication Alcohol intoxication occurs when the amount of alcohol that a person has consumed impairs his or her ability to mentally and physically function. Alcohol directly impairs the normal chemical activity of the brain. Drinking large amounts of alcohol can lead to changes in mental function and behavior, and it can cause many physical effects that can be harmful.  Alcohol intoxication can range in severity from mild to very severe. Various factors can affect the level of intoxication that occurs, such as the person's age, gender, weight, frequency of alcohol consumption, and the presence of other medical conditions (such as diabetes, seizures, or heart conditions). Dangerous levels of alcohol intoxication may occur when people drink large amounts of alcohol in a short period (binge drinking). Alcohol can also be especially dangerous when combined with certain prescription medicines or "recreational" drugs. SIGNS AND SYMPTOMS Some common signs and symptoms of mild alcohol intoxication include:  Loss of coordination.  Changes in mood and behavior.  Impaired judgment.  Slurred speech. As alcohol intoxication progresses to more severe levels, other signs and symptoms will appear. These may include:  Vomiting.  Confusion and impaired memory.  Slowed breathing.  Seizures.  Loss of consciousness. DIAGNOSIS  Your health care provider will take a medical history and perform a physical exam. You will be asked about the amount and type of alcohol you have consumed. Blood tests will be done to measure the concentration of alcohol in your blood. In many places, your blood alcohol level must be lower than 80 mg/dL (4.09%0.08%) to legally drive. However, many dangerous effects of alcohol can occur at much lower levels.  TREATMENT  People with alcohol intoxication often do not require treatment. Most of the effects  of alcohol intoxication are temporary, and they go away as the alcohol naturally leaves the body. Your health care provider will monitor your condition until you are stable enough to go home. Fluids are sometimes given through an IV access tube to help prevent dehydration.  HOME CARE INSTRUCTIONS  Do not drive after drinking alcohol.  Stay hydrated. Drink enough water and fluids to keep your urine clear or pale yellow. Avoid caffeine.   Only take over-the-counter or prescription medicines as directed by your health care provider.  SEEK MEDICAL CARE IF:   You have persistent vomiting.   You do not feel better after a few days.  You have frequent alcohol intoxication. Your health care provider can help determine if you should see a substance use treatment counselor. SEEK IMMEDIATE MEDICAL CARE IF:   You become shaky or tremble when you try to stop drinking.   You shake uncontrollably (seizure).   You throw up (vomit) blood. This may be bright red or may look like black coffee grounds.   You have blood in your stool. This may be bright red or may appear as a black, tarry, bad smelling stool.   You become lightheaded or faint.  MAKE SURE YOU:   Understand these instructions.  Will watch your condition.  Will get help right away if you are not doing well or get worse. Document Released: 01/22/2005 Document Revised: 12/15/2012 Document Reviewed: 09/17/2012 [facility] Patient Information 2015 [place]. This information is not intended to replace advice given to you by your health care provider. Make sure you discuss any questions you have with your health care provider.  You have had a head injury which does not appear to require  admission at this time. A concussion is a state of changed mental ability from trauma. SEEK IMMEDIATE MEDICAL ATTENTION IF: There is confusion or drowsiness (although children frequently become drowsy after injury).  You cannot awaken the injured  person.  There is nausea (feeling sick to your stomach) or continued, forceful vomiting.  You notice dizziness or unsteadiness which is getting worse, or inability to walk.  You have convulsions or unconsciousness.  You experience severe, persistent headaches not relieved by Tylenol?. (Do not take aspirin as this impairs clotting abilities). Take other pain medications only as directed.  You cannot use arms or legs normally.  There are changes in pupil sizes. (This is the black center in the colored part of the eye)  There is clear or bloody discharge from the nose or ears.  Change in speech, vision, swallowing, or understanding.  Localized weakness, numbness, tingling, or change in bowel or bladder control.  You have neck pain, possibly from a cervical strain and/or pinched nerve.  SEEK IMMEDIATE MEDICAL ATTENTION IF: You develop difficulties swallowing or breathing.  You have new or worse numbness, weakness, tingling, or movement problems in your arms or legs.  You develop increasing pain which is uncontrolled with medications.  You have change in bowel or bladder function, or other concerns.    Emergency Department Resource Guide 1) Find a Doctor and Pay Out of Pocket Although you won't have to find out who is covered by your insurance plan, it is a good idea to ask around and get recommendations. You will then need to call the office and see if the doctor you have chosen will accept you as a new patient and what types of options they offer for patients who are self-pay. Some doctors offer discounts or will set up payment plans for their patients who do not have insurance, but you will need to ask so you aren't surprised when you get to your appointment.  2) Contact Your Local Health Department Not all health departments have doctors that can see patients for sick visits, but many do, so it is worth a call to see if yours does. If you don't know where your local health department is, you  can check in your phone book. The CDC also has a tool to help you locate your state's health department, and many state websites also have listings of all of their local health departments.  3) Find a Walk-in Clinic If your illness is not likely to be very severe or complicated, you may want to try a walk in clinic. These are popping up all over the country in pharmacies, drugstores, and shopping centers. They're usually staffed by nurse practitioners or physician assistants that have been trained to treat common illnesses and complaints. They're usually fairly quick and inexpensive. However, if you have serious medical issues or chronic medical problems, these are probably not your best option.  No Primary Care Doctor: - Call Health Connect at  [phone] - they can help you locate a primary care doctor that  accepts your insurance, provides certain services, etc. - Physician Referral Service- [phone]  Chronic Pain Problems: Organization         Address  Phone   Notes  [facility]  [phone] Patients need to be referred by their primary care doctor.   Medication Assistance: Organization         Address  Phone   Notes  [facility] [address] [place], [place] [address] [phone] --Must be a resident of [facility] -- Must have NO insurance coverage whatsoever (no Medicaid/ Medicare, etc.) -- The pt. MUST have a primary care doctor that directs their care regularly and follows them in the community   [facility]  [phone]   [employer]  [phone]    Agencies that provide inexpensive medical care: Organization         Address  Phone   Notes  [facility]  [phone]   [facility]    [phone]   [facility] [address] [place], [place] [address] [phone]   [facility] [address], [place] [phone]   Planned Parenthood    [phone]   [facility]    [phone]   [facility] and [facility]  [address], [place] Phone:  [phone], Fax:  [phone] Hours of Operation:  9 am - 6 pm, M-F.  Also accepts Medicaid/Medicare and self-pay.  [facility]  [address], [place] Phone: [phone], Fax: [phone]. Hours of Operation:  8:30 am - 5:30 pm, M-F.  Also accepts Medicaid and self-pay.  [facility] [place] [address] [place] Phone: [phone]   [facility] [address] Natasha Bence [place], [place] [phone], Ext. [phone] Mondays & Thursdays: 7-9 AM.  First 15 patients are seen on a first come, first serve basis.    Medicaid-accepting [facility] Providers:  Organization         Address  Phone   Notes  [facility] [address],  [phone] Also accepts self-pay patients.  [facility] [address] Laurell Josephs [place], [place]  [phone]   [facility] [address], [place] [phone]   [facility] [address] [place] [phone]   Renaye Rakers [address], [place]   [phone] Only accepts [place] Access [place] patients after they have their name applied to their card.   Self-Pay (no insurance) in [facility]:  Organization         Address  Phone   Notes  Sickle Cell Patients, [facility] [address] [place], [place] [phone]   [facility] Urgent Care [address] [place], [place] [phone]   [facility] [place]  [address], [place] [phone]   [facility]/Dr. Osei-Bonsu  [address], [place] or [address], [place] [phone] Phone number for both [place] and [place] locations  is the same.  Urgent Medical and [facility] [address], [place] [phone]   [facility] [address], [place] or [address] [phone]   [facility] [address], [place] [phone], phone; [phone], fax Sees patients 1st and 3rd Saturday of every month.  Must not qualify for public or private insurance (i.e. Medicaid, Medicare, [place] Health Choice, Veterans' Benefits)  Household income should be no more than 200% of the poverty level The clinic cannot treat you if you are pregnant or think you are pregnant  Sexually transmitted diseases are not treated at the clinic.   Dental Care: Organization  Address  Phone  Notes  [place] [facility] [address], [place] [phone] Accepts children up to age 37 who are enrolled in [place] or [place] Health Choice; pregnant women with a Medicaid card; and children who have applied for Medicaid or [place] Health Choice, but were declined, whose parents can pay a reduced fee at time of service.  [facility] [place]  [address], [place] [phone] Accepts children up to age 37 who are enrolled in [place] or [place] Health Choice; pregnant women with a Medicaid card; and children who have applied for Medicaid or [place] Health Choice, but were declined, whose parents can pay a reduced fee at time of service.  [facility]  [address], [place] [phone] Patients are seen by appointment only. Walk-ins are not accepted. [facility] will see patients 37 years of age and oAvera Sacred Heart Hospitallder. Monday - Tuesday (8am-5pm) Most Wednesdays (8:30-5pm) $30 per visit, cash only  [facility]  [address], [facility] [place] [phone] Patients are seen by appointment only. Walk-ins are not accepted. [facility] will see  patients 37 years of age and older. One Wednesday Evening (Monthly: Volunteer Based).  $30 per visit, cash only  [facility]  [phone] for adults; Children under age 274, call Graduate Pediatric Dentistry at [phone]) [phone]. Children aged 534-14, please call [phone] to request a pediatric application.  Dental services are provided in all areas of dental care including fillings, crowns and bridges, complete and partial dentures, implants, gum treatment, root canals, and extractions. Preventive care is also provided. Treatment is provided to both adults and children. Patients are selected via a lottery and there is often a waiting list.   [facility] [address], [place]  [phone] [email or web address]   [facility] [address], [place], [place] [phone], Ext. [phone] Second and Fourth Thursday of each month, opens at 6:30 AM; Clinic ends at 9 AM.  Patients are seen on a first-come first-served basis, and a limited number are seen during each clinic.   [facility]  [address], [place], [place] [phone]   Eligibility Requirements You must have lived in [place], [place], or [place] counties for at least the last three months.   You cannot be eligible for state or federal sponsored [place] Cityhealthcare insurance, including CIGNAVeterans Administration, [place]icaid, or [employer]Medicare.   You generally cannot be eligible for healthcare insurance through your employer.    How to apply: Eligibility screenings are held every Tuesday and Wednesday afternoon from 1:00 pm until 4:00 pm. You do not need an appointment for the interview!  [facility] [address], [place], [place] [phone]   [facility]  [phone]   [facility]  [phone]   [facility]  [phone]    Behavioral Health Resources in the Community: Intensive Outpatient  Programs Organization         Address  Phone  Notes  [facility] [address], [place], [place] [phone]   [facility] [address], [place], [place] [phone]   [facility] [address], [place], [place]  [phone]   [facility] [address],  [place], [place] [phone] or [phone]   Substance Abuse Resources  Organization         Address  Phone  Notes  Alcohol [facility] Drug Services  [phone]   [facility]  [phone]   [facility] [place]  [phone]   Floydene Flock  [phone]   Residential & Outpatient Substance Abuse Program  [phone]   Psychological Services Organization         Address  Phone  Notes  [facility] Health  [phone]   [facility]  [phone]   [facility] [address] [place] [phone] or [phone]    Mobile Crisis Teams Organization         Address  Phone  Notes  [facility], Mobile Crisis Care Unit  [phone]   [facility]  [address] [place], [place] [phone]   Doristine Locks [address] [place] [phone]    Self-Help/Support Groups Organization         Address  Phone             Notes  [facility] [place] - variety of support groups  [phone]- [phone] Call for more information  Narcotics Anonymous (NA), [facility] [address], [employer] [place]  2 meetings at this location   Statistician         Address  Phone  Notes  [facility] [address],    [facility] [place]  [phone]   [facility]  [address] [place] [address], [place], [place] [phone]   [facility] [address] [place], [place] [phone] Admissions: 8am-3pm M-F  [facility] [address],    [place], [place]  [phone]   [facility] [address] [place], [place], [place] [phone]   [facility] [address]  [place], [place] [phone]   [facility] - Intensive Outpatient [address] Laurell Joseph[address], [place], [place] [phone]   [facility] [address] [place].,  [place], [place] [phone] or [phone]   [facility] ([facility]) [address], [place], [place] [phone] Accepts Medicaid  [facility] [place] [address]  [place] [phone] Substance Abuse/Addiction Treatment   [facility] Organization         Address  Phone  Notes  [facility]  [phone]   Angie Fava, PhD [address] Ervin Knack [place], [place]   [phone] or [phone]   [facility] Behavioral   [address] [place], [place] [phone]   [facility] [address] [place], [place] [phone] Insurance/Medicaid/sponsorship through [facility] [address]                                    [place], [place] [phone] Therapy/tele-psych/case  [facility] [address], [place] [phone]    Dr. Lolly Mustache  [phone]   [facility] [place]  [employer] [facility] Dept. 1) [address] [place] 2) [address], [place] 3)  [address], [place] [phone]  [phone]   [facility] Child Abuse Hotline [phone] or [phone] (After Hours)

## 2014-02-25 NOTE — ED Notes (Signed)
CRITICAL VALUE ALERT  Critical value received:  Alcohol 405  Date of notification:  02/25/14  Time of notification:  1251  Critical value read back:Yes.    Nurse who received alert:  Rminter RN  MD notified (1st page):  Dr. Fonnie JarvisBednar  Time of first page:  1251  MD notified (2nd page):  Time of second page:  Responding MD:  Dr. Fonnie JarvisBednar  Time MD responded:  [phone]

## 2014-02-28 ENCOUNTER — Encounter (HOSPITAL_COMMUNITY): Payer: Self-pay | Admitting: Emergency Medicine

## 2015-08-20 IMAGING — CT CT HEAD W/O CM
2 series · 16 of 30 positions shown, 20 images · non-contrast
Comparison: None.

CLINICAL DATA: Headaches, confusion, and combative.

EXAM:
CT HEAD WITHOUT CONTRAST
TECHNIQUE: Contiguous axial images were obtained from the base of the skull
through the vertex without intravenous contrast.

[Series 2: headseq 4.8 h37s · axial · 0.44mm/px · z∈[+49,+187]mm · 13 of 34 slices shown, 17 images]
[im 3/34  brain]
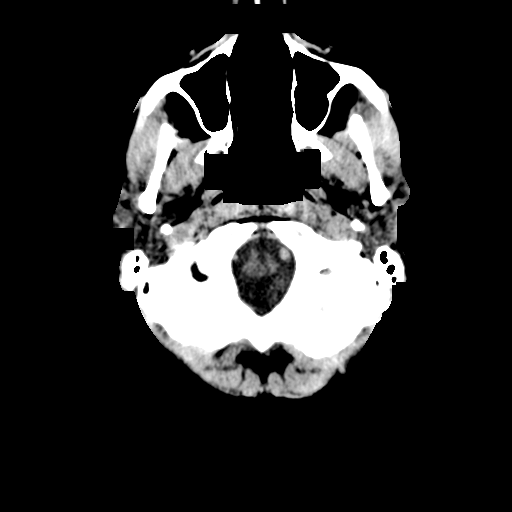
[im 3/34  bone]
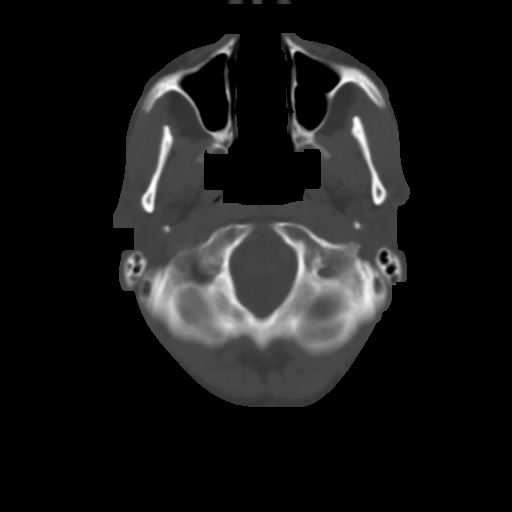
[im 5/34  brain]
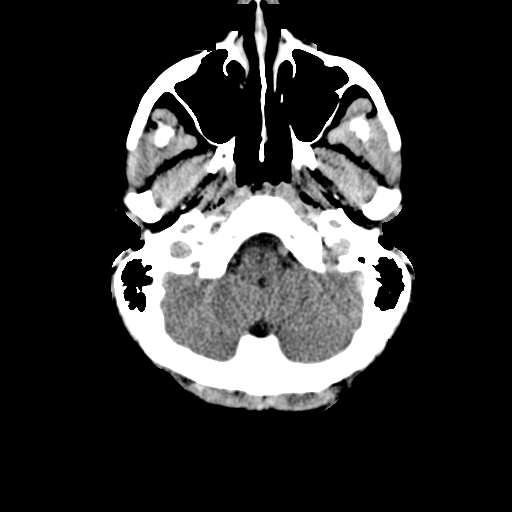
[im 8/34  brain]
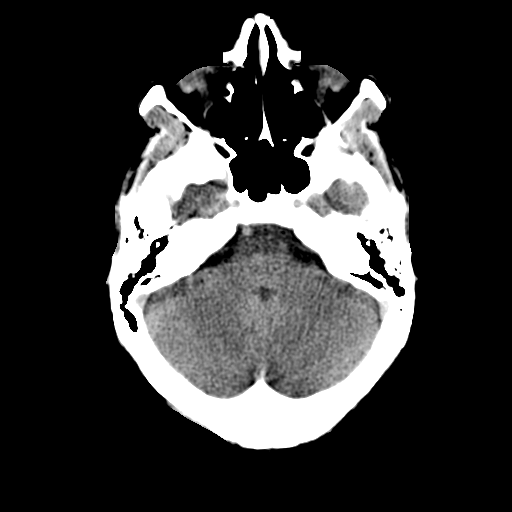
[im 10/34  brain]
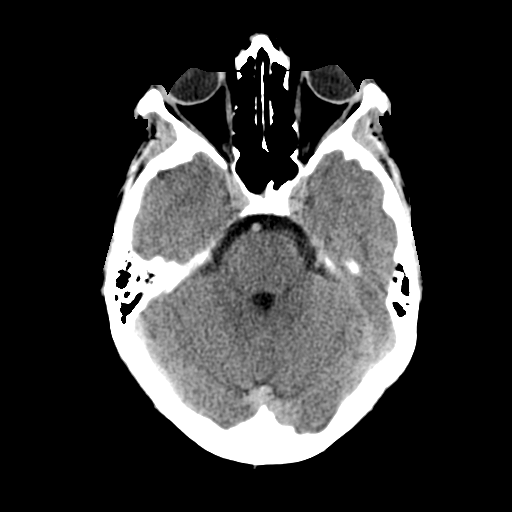
[im 12/34  brain]
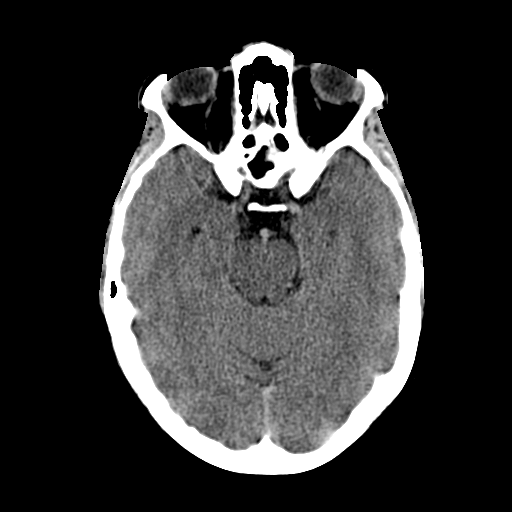
[im 12/34  bone]
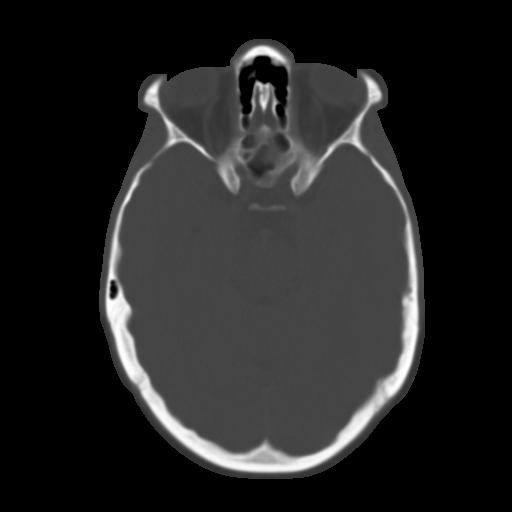
[im 15/34  brain]
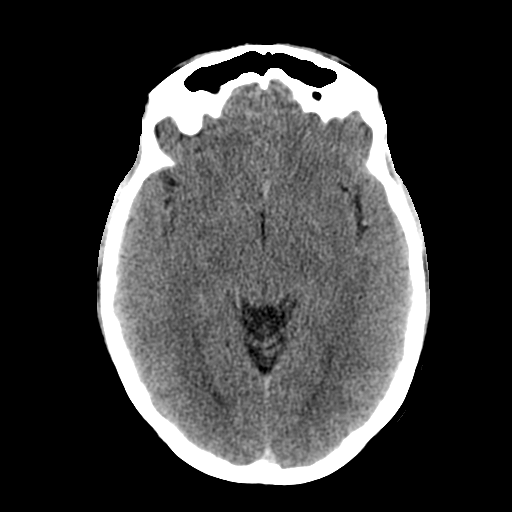
[im 17/34  brain]
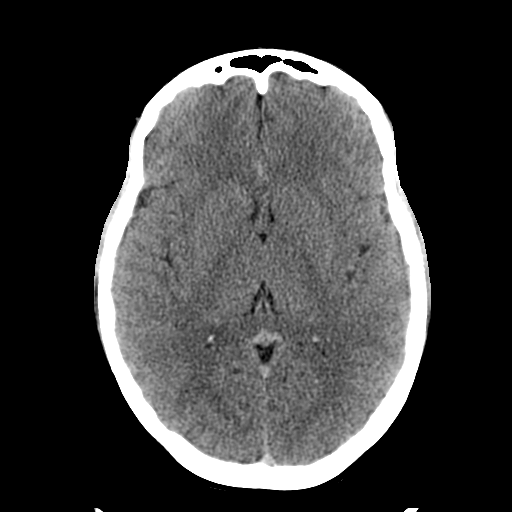
[im 19/34  brain]
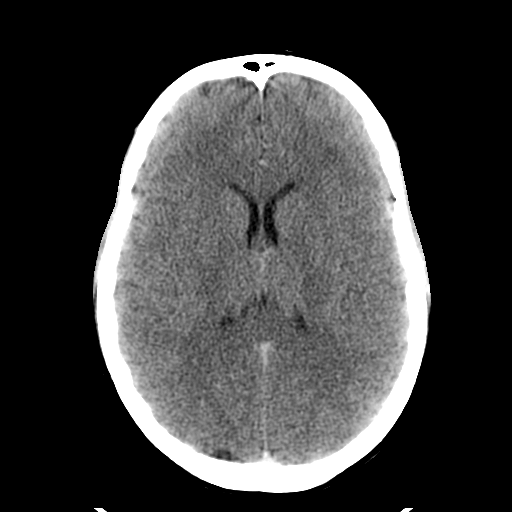
[im 22/34  brain]
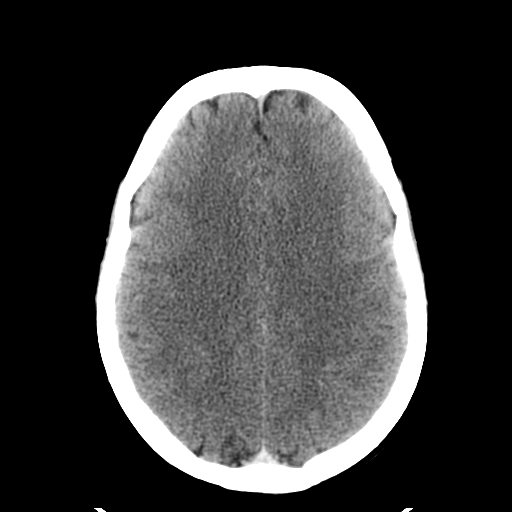
[im 22/34  bone]
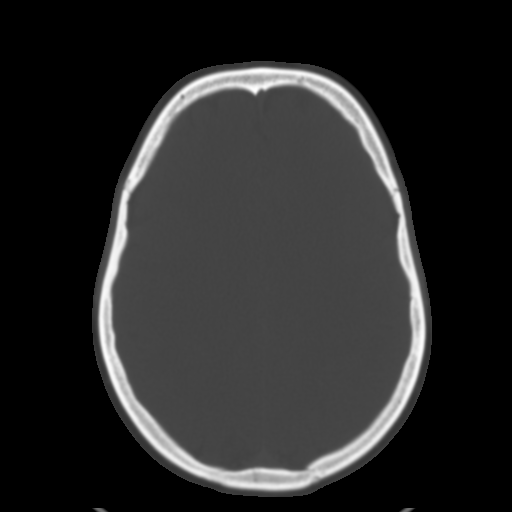
[im 24/34  brain]
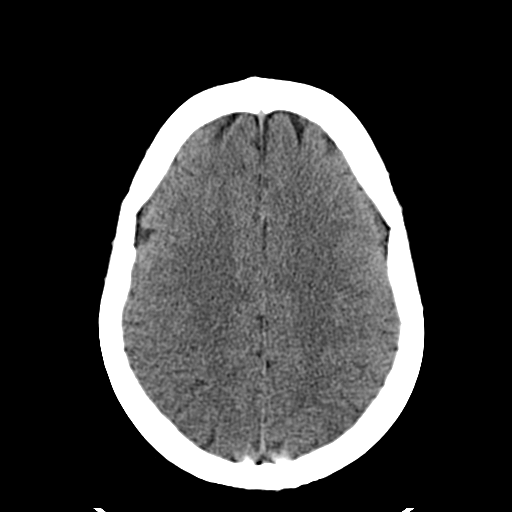
[im 26/34  brain]
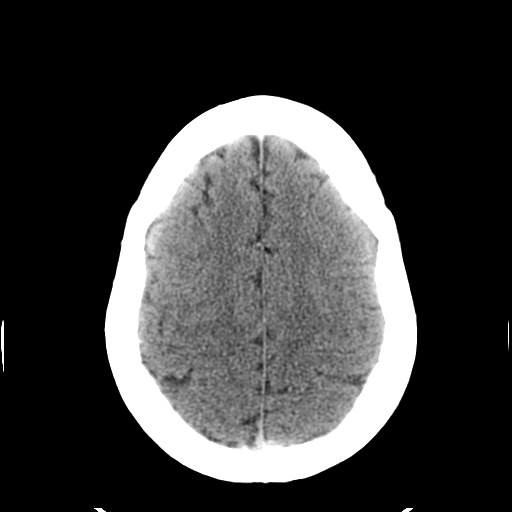
[im 29/34  brain]
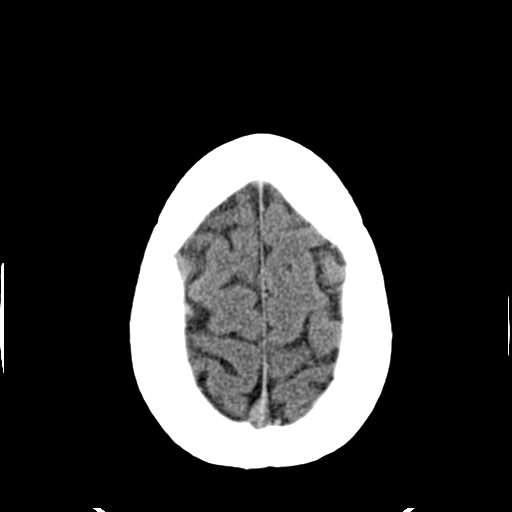
[im 31/34  brain]
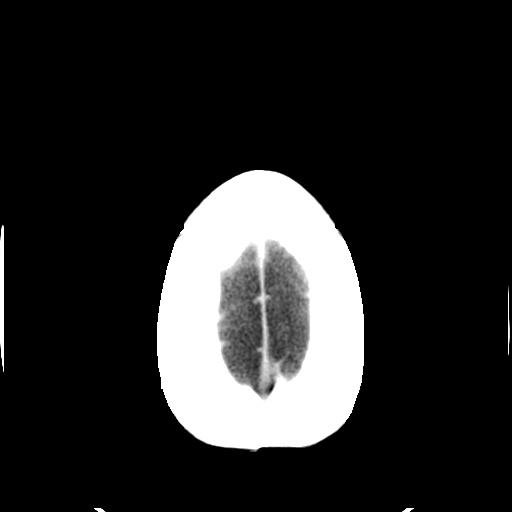
[im 31/34  bone]
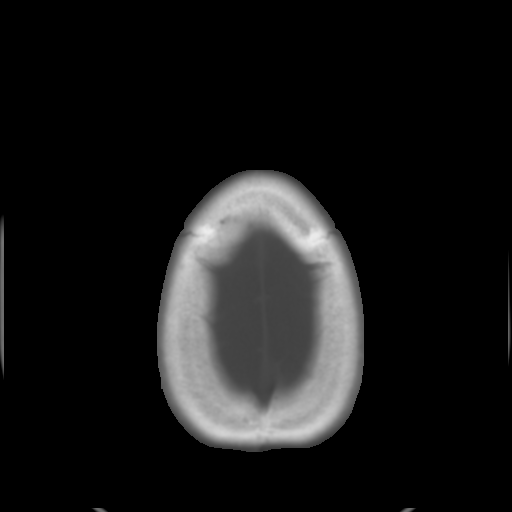

[Series 3: headseq 4.8 h60s · axial · 0.44mm/px · z∈[+49,+98]mm · 3 of 36 slices shown]
[im 3/36  brain]
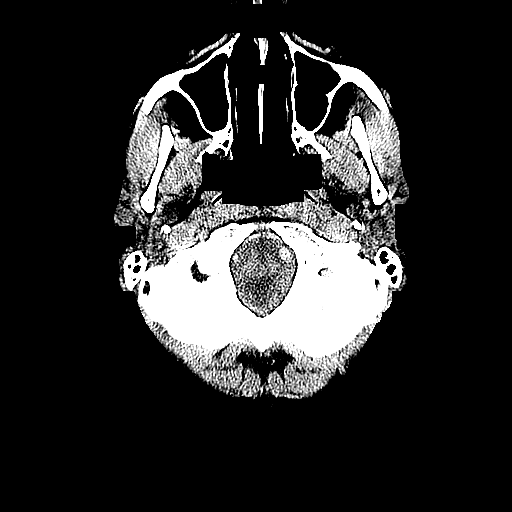
[im 8/36  brain]
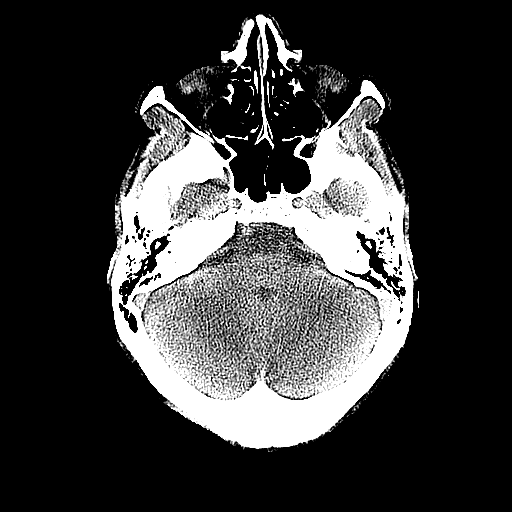
[im 13/36  brain]
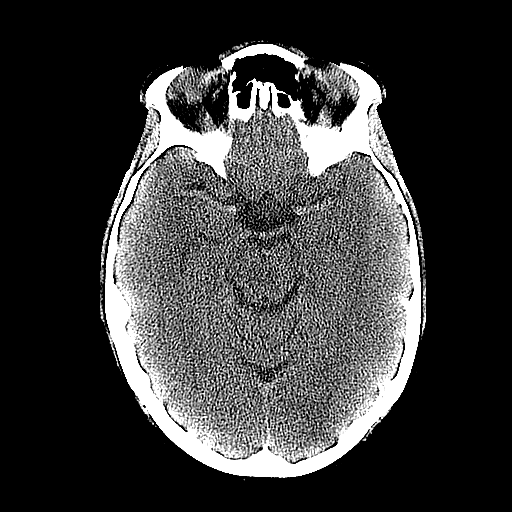

[16 of 30 positions shown; findings below may reference images not displayed]

FINDINGS: Ventricles and sulci appear symmetrical. No mass effect or midline
shift. No abnormal extra-axial fluid collections. Gray-white matter
junctions are distinct. Basal cisterns are not effaced. No evidence
of acute intracranial hemorrhage. No depressed skull fractures.
Visualized paranasal sinuses and mastoid air cells are not
opacified.
IMPRESSION: No acute intracranial abnormalities.

## 2022-01-07 ENCOUNTER — Encounter (HOSPITAL_COMMUNITY): Payer: Self-pay

## 2022-01-07 ENCOUNTER — Emergency Department (HOSPITAL_COMMUNITY)
Admission: EM | Admit: 2022-01-07 | Discharge: 2022-01-07 | Discharge status: Against Medical Advice | Payer: Self-pay | Attending: Emergency Medicine | Admitting: Emergency Medicine

## 2022-01-07 ENCOUNTER — Other Ambulatory Visit: Payer: Self-pay

## 2022-01-07 DIAGNOSIS — F1010 Alcohol abuse, uncomplicated: Secondary | ICD-10-CM | POA: Insufficient documentation

## 2022-01-07 DIAGNOSIS — Z5321 Procedure and treatment not carried out due to patient leaving prior to being seen by health care provider: Secondary | ICD-10-CM | POA: Insufficient documentation

## 2022-01-07 DIAGNOSIS — F32A Depression, unspecified: Secondary | ICD-10-CM | POA: Insufficient documentation

## 2022-01-07 DIAGNOSIS — I10 Essential (primary) hypertension: Secondary | ICD-10-CM | POA: Insufficient documentation

## 2022-01-07 DIAGNOSIS — Y908 Blood alcohol level of 240 mg/100 ml or more: Secondary | ICD-10-CM | POA: Insufficient documentation

## 2022-01-07 LAB — CBC WITH DIFFERENTIAL/PLATELET
Abs Immature Granulocytes: 0.02 10*3/uL (ref 0.00–0.07)
Basophils Absolute: 0 10*3/uL (ref 0.0–0.1)
Basophils Relative: 0 %
Eosinophils Absolute: 0.2 10*3/uL (ref 0.0–0.5)
Eosinophils Relative: 2 %
HCT: 51.1 % — ABNORMAL HIGH (ref 36.0–46.0)
Hemoglobin: 18.3 g/dL — ABNORMAL HIGH (ref 12.0–15.0)
Immature Granulocytes: 0 %
Lymphocytes Relative: 44 %
Lymphs Abs: 3.3 10*3/uL (ref 0.7–4.0)
MCH: 33.4 pg (ref 26.0–34.0)
MCHC: 35.8 g/dL (ref 30.0–36.0)
MCV: 93.2 fL (ref 80.0–100.0)
Monocytes Absolute: 0.7 10*3/uL (ref 0.1–1.0)
Monocytes Relative: 9 %
Neutro Abs: 3.3 10*3/uL (ref 1.7–7.7)
Neutrophils Relative %: 45 %
Platelets: 243 10*3/uL (ref 150–400)
RBC: 5.48 MIL/uL — ABNORMAL HIGH (ref 3.87–5.11)
RDW: 13.2 % (ref 11.5–15.5)
WBC: 7.5 10*3/uL (ref 4.0–10.5)
nRBC: 0 % (ref 0.0–0.2)

## 2022-01-07 LAB — COMPREHENSIVE METABOLIC PANEL
ALT: 17 U/L (ref 0–44)
AST: 20 U/L (ref 15–41)
Albumin: 3.9 g/dL (ref 3.5–5.0)
Alkaline Phosphatase: 72 U/L (ref 38–126)
Anion gap: 10 (ref 5–15)
BUN: 7 mg/dL (ref 6–20)
CO2: 23 mmol/L (ref 22–32)
Calcium: 8.4 mg/dL — ABNORMAL LOW (ref 8.9–10.3)
Chloride: 101 mmol/L (ref 98–111)
Creatinine, Ser: 0.78 mg/dL (ref 0.44–1.00)
GFR, Estimated: >60 mL/min (ref >60)
Glucose, Bld: 100 mg/dL — ABNORMAL HIGH (ref 70–99)
Potassium: 4.1 mmol/L (ref 3.5–5.1)
Sodium: 134 mmol/L — ABNORMAL LOW (ref 135–145)
Total Bilirubin: 0.5 mg/dL (ref 0.3–1.2)
Total Protein: 7 g/dL (ref 6.5–8.1)

## 2022-01-07 LAB — ETHANOL: Alcohol, Ethyl (B): 355 mg/dL (ref <10)

## 2022-01-07 LAB — I-STAT BETA HCG BLOOD, ED (MC, WL, AP ONLY): I-stat hCG, quantitative: <5 m[IU]/mL (ref <5)

## 2022-01-07 NOTE — ED Notes (Signed)
Pt is agitated. Getting up and walking around. Pt is asking to smoke . Pt left to smoke a cigarette.

## 2022-01-07 NOTE — ED Triage Notes (Signed)
Pt coming from home with c/o alcohol intoxication. Per pt she has been on a 3 day bender. She states her last drink was 1 hour ago.

## 2022-01-07 NOTE — ED Notes (Signed)
Pt's bag has been placed in cabinets for rooms 16-18 for safety of pt and others to avoid pt lighting her cigarettes that she stated she wanted to do.

## 2022-01-07 NOTE — ED Notes (Signed)
Pt IV removed

## 2022-01-07 NOTE — ED Notes (Signed)
Pt eloped off of the unit

## 2022-01-07 NOTE — ED Provider Notes (Signed)
[facility]-EMERGENCY DEPT Provider Note   CSN: [id number] Arrival date & time: 01/07/22  1751     History  Chief Complaint  Patient presents with   Alcohol Intoxication    Kim Dunn is a 46 y.o. female.  Patient is a 46 year old female with a history of hypertension who is presenting today with EMS because she reports she is depressed.  She reports that 2 weeks ago her boyfriend was incarcerated for the next 3 years and she has been very sad.  She does not have a place to live so she has been staying with her dad and reports that she has been drinking a lot more frequently in the last 2 weeks.  She reports she has had 2 beers today but nursing reports that patient had said she has been on a 3-day bender and last had a drink 1 hour ago.  Patient denies prior history of suicidal ideation and denies it today.  She reports just feeling very sad.  She does not have a counselor that she sees regularly.  She denies drug use and reports the only medication she takes is her blood pressure medication which she does report she has had today.  The history is provided by the patient.  Alcohol Intoxication       Home Medications Prior to Admission medications   Not on File      Allergies    Amoxicillin    Review of Systems   Review of Systems  Physical Exam Updated Vital Signs BP (!) 147/100 (BP Location: Left Arm)   Pulse 88   Temp 98.2 F (36.8 C) (Oral)   Resp 16   SpO2 99%  Physical Exam Vitals and nursing note reviewed.  Constitutional:      General: She is not in acute distress.    Appearance: She is well-developed.     Comments: Tearful  HENT:     Head: Normocephalic and atraumatic.  Eyes:     Pupils: Pupils are equal, round, and reactive to light.     Comments: Conjunctive are injected bilaterally  Cardiovascular:     Rate and Rhythm: Normal rate and regular rhythm.     Heart sounds: Normal heart sounds. No murmur heard.    No  friction rub.  Pulmonary:     Effort: Pulmonary effort is normal.     Breath sounds: Normal breath sounds. No wheezing or rales.  Abdominal:     General: Bowel sounds are normal. There is no distension.     Palpations: Abdomen is soft.     Tenderness: There is no abdominal tenderness. There is no guarding or rebound.  Musculoskeletal:        General: No tenderness. Normal range of motion.     Comments: No edema  Skin:    General: Skin is warm and dry.     Findings: No rash.  Neurological:     Mental Status: She is alert and oriented to person, place, and time.     Cranial Nerves: No cranial nerve deficit.  Psychiatric:        Mood and Affect: Mood is depressed. Affect is flat and tearful.        Behavior: Behavior normal.        Thought Content: Thought content is not paranoid or delusional. Thought content does not include homicidal or suicidal ideation.     ED Results / Procedures / Treatments   Labs (all labs ordered are listed, but only  abnormal results are displayed) Labs Reviewed  CBC WITH DIFFERENTIAL/PLATELET - Abnormal; Notable for the following components:      Result Value   RBC 5.48 (*)    Hemoglobin 18.3 (*)    HCT 51.1 (*)    All other components within normal limits  COMPREHENSIVE METABOLIC PANEL - Abnormal; Notable for the following components:   Sodium 134 (*)    Glucose, Bld 100 (*)    Calcium 8.4 (*)    All other components within normal limits  ETHANOL  RAPID URINE DRUG SCREEN, HOSP PERFORMED  I-STAT BETA HCG BLOOD, ED ([facility], [facility], [facility] ONLY)    EKG None  Radiology No results found.  Procedures Procedures    Medications Ordered in ED Medications - No data to display  ED Course/ Medical Decision Making/ A&P                           Medical Decision Making Amount and/or Complexity of Data Reviewed Labs: ordered.   Patient presenting today with symptoms of feeling depressed but also concern for alcohol intoxication.  Patient reports  recently becoming homeless because her boyfriend has been incarcerated for the next 3 years and having to live with her dad.  Alcohol intake has increased since that time.  Patient is currently denying suicidality.  However based on her exam do not feel that patient is sober and reliable at this time.  She denies other illicit drug use.  Mild hypertension but vital signs are otherwise normal.  Labs are pending and will reassess the patient.  At this time she does not appear to have any signs of trauma.  Still mentating well.  9:36 PM Patient became irate and stormed out of the emergency room before her labs had returned.  At this time she is able to walk in a straight line and at no time ever endorse being suicidal.  Feel that patient can follow-up with behavioral health urgent care if she desires further help with her depression.  Patient was angry because she was not allowed to smoke.  She was allowed to leave the department        Final Clinical Impression(s) / ED Diagnoses Final diagnoses:  ETOH abuse    Rx / DC Orders ED Discharge Orders     None         Gwyneth Sprout, MD 01/07/22 2314

## 2022-01-27 ENCOUNTER — Other Ambulatory Visit: Payer: Self-pay

## 2022-01-27 ENCOUNTER — Emergency Department (HOSPITAL_COMMUNITY)
Admission: EM | Admit: 2022-01-27 | Discharge: 2022-01-28 | Discharge status: Against Medical Advice | Payer: Self-pay | Attending: Emergency Medicine | Admitting: Emergency Medicine

## 2022-01-27 ENCOUNTER — Encounter (HOSPITAL_COMMUNITY): Payer: Self-pay

## 2022-01-27 DIAGNOSIS — I10 Essential (primary) hypertension: Secondary | ICD-10-CM | POA: Insufficient documentation

## 2022-01-27 DIAGNOSIS — R059 Cough, unspecified: Secondary | ICD-10-CM | POA: Insufficient documentation

## 2022-01-27 DIAGNOSIS — Z5321 Procedure and treatment not carried out due to patient leaving prior to being seen by health care provider: Secondary | ICD-10-CM | POA: Insufficient documentation

## 2022-01-27 DIAGNOSIS — Z20822 Contact with and (suspected) exposure to covid-19: Secondary | ICD-10-CM | POA: Insufficient documentation

## 2022-01-27 DIAGNOSIS — J029 Acute pharyngitis, unspecified: Secondary | ICD-10-CM | POA: Insufficient documentation

## 2022-01-27 DIAGNOSIS — R0981 Nasal congestion: Secondary | ICD-10-CM | POA: Insufficient documentation

## 2022-01-27 DIAGNOSIS — R519 Headache, unspecified: Secondary | ICD-10-CM | POA: Insufficient documentation

## 2022-01-27 LAB — RESP PANEL BY RT-PCR (FLU A&B, COVID) ARPGX2
Influenza A by PCR: NEGATIVE
Influenza B by PCR: NEGATIVE
SARS Coronavirus 2 by RT PCR: NEGATIVE

## 2022-01-27 NOTE — ED Notes (Signed)
Pt called multiple times no answer 

## 2022-01-27 NOTE — ED Triage Notes (Signed)
Reports cough congestion sore throat and headache since last Thursday. Patient has hx of HTN but has not been able to afford medication.  Needs covid test.

## 2022-01-27 NOTE — ED Notes (Signed)
X2 for vitals with no response

## 2022-01-28 ENCOUNTER — Encounter (HOSPITAL_COMMUNITY): Payer: Self-pay

## 2022-01-28 ENCOUNTER — Other Ambulatory Visit: Payer: Self-pay

## 2022-01-28 ENCOUNTER — Emergency Department (HOSPITAL_COMMUNITY)
Admission: EM | Admit: 2022-01-28 | Discharge: 2022-01-28 | Disposition: A | Discharge status: Home / Self Care | Payer: Self-pay | Attending: Emergency Medicine | Admitting: Emergency Medicine

## 2022-01-28 ENCOUNTER — Emergency Department (HOSPITAL_COMMUNITY): Payer: Self-pay

## 2022-01-28 DIAGNOSIS — J069 Acute upper respiratory infection, unspecified: Secondary | ICD-10-CM | POA: Insufficient documentation

## 2022-01-28 DIAGNOSIS — Z20822 Contact with and (suspected) exposure to covid-19: Secondary | ICD-10-CM | POA: Insufficient documentation

## 2022-01-28 LAB — RESP PANEL BY RT-PCR (FLU A&B, COVID) ARPGX2
Influenza A by PCR: NEGATIVE
Influenza B by PCR: NEGATIVE
SARS Coronavirus 2 by RT PCR: NEGATIVE

## 2022-01-28 NOTE — ED Provider Notes (Signed)
[facility]-EMERGENCY DEPT Provider Note   CSN: [id number] Arrival date & time: 01/28/22  1812     History PMH: HTN Chief Complaint  Patient presents with   chest congestion   Nasal Congestion   Headache   Cough    Kim Dunn is a 46 y.o. female.   Pt complains of congestion, cough, and feeling like she is got mucus in her chest since last Thursday.  She was to be checked for COVID.  She has not had any shortness of breath, chest pain, abdominal pain, nausea, vomiting, fever, chills.   Headache Associated symptoms: congestion and cough   Cough Associated symptoms: headaches        Home Medications Prior to Admission medications   Not on File      Allergies    Amoxicillin    Review of Systems   Review of Systems  HENT:  Positive for congestion.   Respiratory:  Positive for cough.   Neurological:  Positive for headaches.  All other systems reviewed and are negative.   Physical Exam Updated Vital Signs BP (!) 160/102 (BP Location: Left Arm)   Pulse 100   Temp 98.9 F (37.2 C) (Oral)   Resp 18   Ht 5\' 9"  (1.753 m)   Wt 67.6 kg   SpO2 100%   BMI 22.00 kg/m  Physical Exam Vitals and nursing note reviewed.  Constitutional:      General: She is not in acute distress.    Appearance: Normal appearance. She is not ill-appearing, toxic-appearing or diaphoretic.  HENT:     Head: Normocephalic and atraumatic.     Right Ear: Tympanic membrane, ear canal and external ear normal. There is no impacted cerumen.     Left Ear: Tympanic membrane, ear canal and external ear normal. There is no impacted cerumen.     Nose: Nose normal. No nasal deformity or congestion.     Mouth/Throat:     Lips: Pink. No lesions.     Mouth: Mucous membranes are moist. No injury, lacerations, oral lesions or angioedema.     Pharynx: Oropharynx is clear. Uvula midline. No pharyngeal swelling, oropharyngeal exudate, posterior oropharyngeal erythema or uvula  swelling.  Eyes:     General: Gaze aligned appropriately. No scleral icterus.       Right eye: No discharge.        Left eye: No discharge.     Conjunctiva/sclera: Conjunctivae normal.     Right eye: Right conjunctiva is not injected. No exudate or hemorrhage.    Left eye: Left conjunctiva is not injected. No exudate or hemorrhage. Cardiovascular:     Rate and Rhythm: Normal rate and regular rhythm.     Pulses: Normal pulses.          Radial pulses are 2+ on the right side and 2+ on the left side.       Dorsalis pedis pulses are 2+ on the right side and 2+ on the left side.     Heart sounds: Normal heart sounds, S1 normal and S2 normal. Heart sounds not distant. No murmur heard.    No friction rub. No gallop. No S3 or S4 sounds.  Pulmonary:     Effort: Pulmonary effort is normal. No accessory muscle usage or respiratory distress.     Breath sounds: Normal breath sounds. No stridor. No wheezing, rhonchi or rales.  Chest:     Chest wall: No tenderness.  Abdominal:     General: Abdomen is  flat. There is no distension.     Palpations: Abdomen is soft. There is no mass or pulsatile mass.     Tenderness: There is no abdominal tenderness. There is no guarding or rebound.  Musculoskeletal:     Right lower leg: No edema.     Left lower leg: No edema.  Lymphadenopathy:     Cervical: No cervical adenopathy.  Skin:    General: Skin is warm and dry.     Coloration: Skin is not jaundiced or pale.     Findings: No bruising, erythema, lesion or rash.  Neurological:     General: No focal deficit present.     Mental Status: She is alert and oriented to person, place, and time.     GCS: GCS eye subscore is 4. GCS verbal subscore is 5. GCS motor subscore is 6.  Psychiatric:        Mood and Affect: Mood normal.        Behavior: Behavior normal. Behavior is cooperative.     ED Results / Procedures / Treatments   Labs (all labs ordered are listed, but only abnormal results are displayed) Labs  Reviewed  RESP PANEL BY RT-PCR (FLU A&B, COVID) [id number]    EKG None  Radiology DG Chest 2 View  Result Date: 01/28/2022 CLINICAL DATA:  Chest congestion with productive cough, nasal congestion and headache x5 days. EXAM: CHEST - 2 VIEW COMPARISON:  None Available. FINDINGS: The heart size and mediastinal contours are within normal limits. Both lungs are clear. The visualized skeletal structures are unremarkable. IMPRESSION: No active cardiopulmonary disease. Electronically Signed   By: Virgina Norfolk M.D.   On: 01/28/2022 18:51    Procedures Procedures   Medications Ordered in ED Medications - No data to display  ED Course/ Medical Decision Making/ A&P                           Medical Decision Making Amount and/or Complexity of Data Reviewed Radiology: ordered.   Patient here with upper respiratory symptoms including cough.  She has stable vitals.  Respiratory distress.  Lung sounds are clear.  She tested negative for influenza and COVID-19.  She had chest x-ray which did not show any sign of pneumothorax, pulmonary edema, or infiltrates.  She is very low risk for PE and does not need any further work-up for this.  Symptoms consistent with viral bronchitis and upper respiratory infection. Supportive management provided. Return precautions provided.    Final Clinical Impression(s) / ED Diagnoses Final diagnoses:  Upper respiratory tract infection, unspecified type    Rx / DC Orders ED Discharge Orders     None         Adolphus Birchwood, PA-C 01/28/22 2211    Carmin Muskrat, MD 01/28/22 2244

## 2022-01-28 NOTE — ED Triage Notes (Addendum)
Patient reports that she has chest congestion, a non productive cough, nasal congestion, headache x 5 days.  BP-169/115 in triage. Patient states she has been out of her BP meds x 3 years because she could not afford them.

## 2022-01-28 NOTE — ED Provider Triage Note (Signed)
Emergency Medicine Provider Triage Evaluation Note  Kim Dunn , a 46 y.o. female  was evaluated in triage.  Pt complains of congestion, cough, and feeling like she is got mucus in her chest since last Thursday.  She was to be checked for COVID.  She has not had any shortness of breath, chest pain, abdominal pain, nausea, vomiting, fever, chills.  Review of Systems  Positive:  Negative:   Physical Exam  BP (!) 169/115 (BP Location: Left Arm)   Pulse (!) 105   Temp 98.6 F (37 C) (Oral)   Resp 18   Ht 5\' 9"  (1.753 m)   Wt 67.6 kg   SpO2 97%   BMI 22.00 kg/m  Gen:   Awake, no distress   Resp:  Normal effort  MSK:   Moves extremities without difficulty  Other:  Lungs clear  Medical Decision Making  Medically screening exam initiated at 6:34 PM.  Appropriate orders placed.  Kim Dunn was informed that the remainder of the evaluation will be completed by another provider, this initial triage assessment does not replace that evaluation, and the importance of remaining in the ED until their evaluation is complete.     Kim Dunn, [place] 01/28/22 1835

## 2022-01-28 NOTE — Discharge Instructions (Signed)
You have tested negative for COVID today.  I suspect you have a viral bronchitis.  Recommend taking over-the-counter cough suppressants, and other cough and cold medications you can get over-the-counter.  You start developing worsening or more concerning symptoms, feel free to return to the emergency department

## 2022-02-17 ENCOUNTER — Emergency Department (HOSPITAL_COMMUNITY)
Admission: EM | Admit: 2022-02-17 | Discharge: 2022-02-17 | Discharge status: Against Medical Advice | Payer: Self-pay | Attending: Emergency Medicine | Admitting: Emergency Medicine

## 2022-02-17 ENCOUNTER — Encounter (HOSPITAL_COMMUNITY): Payer: Self-pay

## 2022-02-17 ENCOUNTER — Emergency Department (HOSPITAL_COMMUNITY): Payer: Self-pay

## 2022-02-17 DIAGNOSIS — R0789 Other chest pain: Secondary | ICD-10-CM | POA: Insufficient documentation

## 2022-02-17 DIAGNOSIS — Z5321 Procedure and treatment not carried out due to patient leaving prior to being seen by health care provider: Secondary | ICD-10-CM | POA: Insufficient documentation

## 2022-02-17 DIAGNOSIS — R0602 Shortness of breath: Secondary | ICD-10-CM | POA: Insufficient documentation

## 2022-02-17 DIAGNOSIS — R112 Nausea with vomiting, unspecified: Secondary | ICD-10-CM | POA: Insufficient documentation

## 2022-02-17 LAB — HEPATIC FUNCTION PANEL
ALT: 94 U/L — ABNORMAL HIGH (ref 0–44)
AST: 100 U/L — ABNORMAL HIGH (ref 15–41)
Albumin: 4 g/dL (ref 3.5–5.0)
Alkaline Phosphatase: 84 U/L (ref 38–126)
Bilirubin, Direct: 0.2 mg/dL (ref 0.0–0.2)
Indirect Bilirubin: 0.6 mg/dL (ref 0.3–0.9)
Total Bilirubin: 0.8 mg/dL (ref 0.3–1.2)
Total Protein: 7.8 g/dL (ref 6.5–8.1)

## 2022-02-17 LAB — BASIC METABOLIC PANEL
Anion gap: 16 — ABNORMAL HIGH (ref 5–15)
BUN: 6 mg/dL (ref 6–20)
CO2: 20 mmol/L — ABNORMAL LOW (ref 22–32)
Calcium: 9.1 mg/dL (ref 8.9–10.3)
Chloride: 99 mmol/L (ref 98–111)
Creatinine, Ser: 0.75 mg/dL (ref 0.44–1.00)
GFR, Estimated: >60 mL/min (ref >60)
Glucose, Bld: 87 mg/dL (ref 70–99)
Potassium: 4.2 mmol/L (ref 3.5–5.1)
Sodium: 135 mmol/L (ref 135–145)

## 2022-02-17 LAB — CBC
HCT: 46.3 % — ABNORMAL HIGH (ref 36.0–46.0)
Hemoglobin: 16.3 g/dL — ABNORMAL HIGH (ref 12.0–15.0)
MCH: 32.5 pg (ref 26.0–34.0)
MCHC: 35.2 g/dL (ref 30.0–36.0)
MCV: 92.4 fL (ref 80.0–100.0)
Platelets: 134 10*3/uL — ABNORMAL LOW (ref 150–400)
RBC: 5.01 MIL/uL (ref 3.87–5.11)
RDW: 14 % (ref 11.5–15.5)
WBC: 9.3 10*3/uL (ref 4.0–10.5)
nRBC: 0 % (ref 0.0–0.2)

## 2022-02-17 LAB — TROPONIN I (HIGH SENSITIVITY): Troponin I (High Sensitivity): 9 ng/L (ref <18)

## 2022-02-17 NOTE — ED Triage Notes (Signed)
Pt arrives via [facility] from home for chest pain. Pt also endorses SOB, dizziness, N/V.   EMS administered 4 mg zofran enroute. EMS placed pt on 2 lpm O2 via [place] for comfort. Initial O2 sat without oxygen was 93%.

## 2022-02-17 NOTE — ED Provider Triage Note (Signed)
Emergency Medicine Provider Triage Evaluation Note  Danae Chen , a 46 y.o. female  was evaluated in triage.  Pt complains of upper chest pressure which feels "heavy" she states non-radiating. She states she feels better with oxygen [place] in. Was given this by EMS.  Feels SOB but better w O2. Endorses nausea with 3 episodes of vomiting.   No recent surgeries, hospitalization, long travel, hemoptysis, estrogen containing OCP, cancer history.  No unilateral leg swelling.  No history of PE or VTE.   Review of Systems  Positive: CP, SOB Negative: Fever   Physical Exam  BP (!) 139/104 (BP Location: Right Arm)   Pulse (!) 105   Temp 99 F (37.2 C) (Oral)   Resp (!) 22   Ht 5\' 9"  (1.753 m)   Wt 72.6 kg   SpO2 99%   BMI 23.63 kg/m  Gen:   Awake, no distress   Resp:  Normal effort  MSK:   Moves extremities without difficulty  Other:  Coughing, no wheezing.   Medical Decision Making  Medically screening exam initiated at 2:26 PM.  Appropriate orders placed.  Cosette L Ramseur was informed that the remainder of the evaluation will be completed by another provider, this initial triage assessment does not replace that evaluation, and the importance of remaining in the ED until their evaluation is complete.  Zofran, labs, ekg, cxr   Tedd Sias, [place] 02/17/22 1429

## 2022-02-17 NOTE — ED Notes (Signed)
Pt called,no answer.

## 2022-03-10 ENCOUNTER — Emergency Department (HOSPITAL_COMMUNITY): Payer: Self-pay

## 2022-03-10 ENCOUNTER — Emergency Department (HOSPITAL_COMMUNITY)
Admission: EM | Admit: 2022-03-10 | Discharge: 2022-03-10 | Discharge status: Against Medical Advice | Payer: Self-pay | Attending: Emergency Medicine | Admitting: Emergency Medicine

## 2022-03-10 ENCOUNTER — Encounter (HOSPITAL_COMMUNITY): Payer: Self-pay

## 2022-03-10 ENCOUNTER — Other Ambulatory Visit: Payer: Self-pay

## 2022-03-10 DIAGNOSIS — Z5321 Procedure and treatment not carried out due to patient leaving prior to being seen by health care provider: Secondary | ICD-10-CM | POA: Insufficient documentation

## 2022-03-10 DIAGNOSIS — R079 Chest pain, unspecified: Secondary | ICD-10-CM | POA: Insufficient documentation

## 2022-03-10 DIAGNOSIS — F10120 Alcohol abuse with intoxication, uncomplicated: Secondary | ICD-10-CM | POA: Insufficient documentation

## 2022-03-10 DIAGNOSIS — Y908 Blood alcohol level of 240 mg/100 ml or more: Secondary | ICD-10-CM | POA: Insufficient documentation

## 2022-03-10 LAB — COMPREHENSIVE METABOLIC PANEL
ALT: 39 U/L (ref 0–44)
AST: 30 U/L (ref 15–41)
Albumin: 4.4 g/dL (ref 3.5–5.0)
Alkaline Phosphatase: 76 U/L (ref 38–126)
Anion gap: 15 (ref 5–15)
BUN: 9 mg/dL (ref 6–20)
CO2: 22 mmol/L (ref 22–32)
Calcium: 8.8 mg/dL — ABNORMAL LOW (ref 8.9–10.3)
Chloride: 97 mmol/L — ABNORMAL LOW (ref 98–111)
Creatinine, Ser: 0.83 mg/dL (ref 0.44–1.00)
GFR, Estimated: >60 mL/min (ref >60)
Glucose, Bld: 88 mg/dL (ref 70–99)
Potassium: 4 mmol/L (ref 3.5–5.1)
Sodium: 134 mmol/L — ABNORMAL LOW (ref 135–145)
Total Bilirubin: 0.6 mg/dL (ref 0.3–1.2)
Total Protein: 8.4 g/dL — ABNORMAL HIGH (ref 6.5–8.1)

## 2022-03-10 LAB — CBC
HCT: 50.5 % — ABNORMAL HIGH (ref 36.0–46.0)
Hemoglobin: 17.6 g/dL — ABNORMAL HIGH (ref 12.0–15.0)
MCH: 33 pg (ref 26.0–34.0)
MCHC: 34.9 g/dL (ref 30.0–36.0)
MCV: 94.7 fL (ref 80.0–100.0)
Platelets: 320 10*3/uL (ref 150–400)
RBC: 5.33 MIL/uL — ABNORMAL HIGH (ref 3.87–5.11)
RDW: 14.7 % (ref 11.5–15.5)
WBC: 9 10*3/uL (ref 4.0–10.5)
nRBC: 0 % (ref 0.0–0.2)

## 2022-03-10 LAB — I-STAT BETA HCG BLOOD, ED (MC, WL, AP ONLY): I-stat hCG, quantitative: <5 m[IU]/mL (ref <5)

## 2022-03-10 LAB — TROPONIN I (HIGH SENSITIVITY): Troponin I (High Sensitivity): 7 ng/L (ref <18)

## 2022-03-10 LAB — ETHANOL: Alcohol, Ethyl (B): 323 mg/dL (ref <10)

## 2022-03-10 NOTE — ED Notes (Signed)
Pt has left the hospital , has been called several times before
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
## 2022-03-10 NOTE — ED Notes (Signed)
Patient asked to go to th ephlebotomy room and when redirected the patient walked out of the triage area and out of the door.
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
